# Patient Record
Sex: Female | Born: 1965 | Hispanic: Yes | Marital: Married | Smoking: Never smoker
Health system: Southern US, Community
[De-identification: ages and names within clinical notes are randomized; demographics above are authoritative.]

---

## 1999-09-05 ENCOUNTER — Encounter: Admission: RE | Admit: 1999-09-05 | Discharge: 1999-09-05 | Payer: Self-pay | Admitting: Gynecology

## 1999-09-05 ENCOUNTER — Encounter: Payer: Self-pay | Admitting: Gynecology

## 2004-06-13 ENCOUNTER — Ambulatory Visit: Payer: Self-pay | Admitting: Orthopedic Surgery

## 2004-07-07 ENCOUNTER — Ambulatory Visit: Payer: Self-pay | Admitting: Obstetrics & Gynecology

## 2005-10-07 ENCOUNTER — Emergency Department: Payer: Self-pay | Admitting: Emergency Medicine

## 2006-09-20 ENCOUNTER — Ambulatory Visit: Payer: Self-pay | Admitting: Obstetrics & Gynecology

## 2007-11-26 ENCOUNTER — Ambulatory Visit: Payer: Self-pay | Admitting: Obstetrics & Gynecology

## 2009-01-21 ENCOUNTER — Ambulatory Visit: Payer: Self-pay | Admitting: Obstetrics & Gynecology

## 2009-01-27 ENCOUNTER — Ambulatory Visit: Payer: Self-pay | Admitting: Obstetrics & Gynecology

## 2009-07-22 ENCOUNTER — Ambulatory Visit: Payer: Self-pay | Admitting: General Surgery

## 2010-07-31 ENCOUNTER — Encounter: Payer: Self-pay | Admitting: Unknown Physician Specialty

## 2010-10-11 ENCOUNTER — Ambulatory Visit: Payer: Self-pay | Admitting: Obstetrics & Gynecology

## 2011-10-03 ENCOUNTER — Ambulatory Visit (INDEPENDENT_AMBULATORY_CARE_PROVIDER_SITE_OTHER): Payer: BC Managed Care – PPO | Admitting: Family Medicine

## 2011-10-03 ENCOUNTER — Encounter: Payer: Self-pay | Admitting: Family Medicine

## 2011-10-03 VITALS — BP 100/60 | HR 76 | Temp 97.9°F

## 2011-10-03 DIAGNOSIS — Z0000 Encounter for general adult medical examination without abnormal findings: Secondary | ICD-10-CM

## 2011-10-03 DIAGNOSIS — Z136 Encounter for screening for cardiovascular disorders: Secondary | ICD-10-CM

## 2011-10-03 LAB — CBC WITH DIFFERENTIAL/PLATELET
Basophils Absolute: 0 10*3/uL (ref 0.0–0.1)
Basophils Relative: 0.7 % (ref 0.0–3.0)
Eosinophils Absolute: 0.1 10*3/uL (ref 0.0–0.7)
Eosinophils Relative: 1.3 % (ref 0.0–5.0)
HCT: 39.8 % (ref 36.0–46.0)
Hemoglobin: 13.2 g/dL (ref 12.0–15.0)
Lymphocytes Relative: 30.9 % (ref 12.0–46.0)
Lymphs Abs: 1.4 10*3/uL (ref 0.7–4.0)
MCHC: 33.2 g/dL (ref 30.0–36.0)
MCV: 93.8 fl (ref 78.0–100.0)
Monocytes Absolute: 0.3 10*3/uL (ref 0.1–1.0)
Monocytes Relative: 6.4 % (ref 3.0–12.0)
Neutro Abs: 2.7 10*3/uL (ref 1.4–7.7)
Neutrophils Relative %: 60.7 % (ref 43.0–77.0)
Platelets: 193 10*3/uL (ref 150.0–400.0)
RBC: 4.24 Mil/uL (ref 3.87–5.11)
RDW: 13.1 % (ref 11.5–14.6)
WBC: 4.4 10*3/uL — ABNORMAL LOW (ref 4.5–10.5)

## 2011-10-03 LAB — COMPREHENSIVE METABOLIC PANEL WITH GFR
ALT: 16 U/L (ref 0–35)
AST: 21 U/L (ref 0–37)
Albumin: 3.9 g/dL (ref 3.5–5.2)
Alkaline Phosphatase: 48 U/L (ref 39–117)
BUN: 14 mg/dL (ref 6–23)
CO2: 29 meq/L (ref 19–32)
Calcium: 9.2 mg/dL (ref 8.4–10.5)
Chloride: 105 meq/L (ref 96–112)
Creatinine, Ser: 0.8 mg/dL (ref 0.4–1.2)
GFR: 79.6 mL/min
Glucose, Bld: 76 mg/dL (ref 70–99)
Potassium: 4.4 meq/L (ref 3.5–5.1)
Sodium: 139 meq/L (ref 135–145)
Total Bilirubin: 0.4 mg/dL (ref 0.3–1.2)
Total Protein: 6.7 g/dL (ref 6.0–8.3)

## 2011-10-03 LAB — LIPID PANEL
Cholesterol: 133 mg/dL (ref 0–200)
HDL: 58.6 mg/dL (ref >39.00)
LDL Cholesterol: 73 mg/dL (ref 0–99)
Total CHOL/HDL Ratio: 2
Triglycerides: 9 mg/dL (ref 0.0–149.0)
VLDL: 1.8 mg/dL (ref 0.0–40.0)

## 2011-10-03 NOTE — Patient Instructions (Signed)
It was so wonderful to meet you. We will call you with results from your blood tests from today.

## 2011-10-03 NOTE — Progress Notes (Signed)
  Subjective:    Patient ID: Debra Wheeler, female    DOB: [birth year], 46 y.o.   MRN: [id number]  HPI  46 yo here to establish care.   G2P2- followed by Annamarie Major, [facility]. Pap from earlier this month revealed ASCUS, neg HPV--advised repeat pap in 1 year.  Strong FH of breast cancer- mother died of breast CA at 83 yo. Sister just had mastectomy due to breast cancer at 44 yo. She had BRCA testing- brings results in with her today-  BRCA1 neg, BRCA2 uncertain.  Has not had other labs checked in years.  Otherwise, very healthy and active.    There is no problem list on file for this patient.  No past medical history on file. No past surgical history on file. History  Substance Use Topics  . Smoking status: Never Smoker   . Smokeless tobacco: Not on file  . Alcohol Use: Not on file   Family History  Problem Relation Age of Onset  . Cancer Sister 60    breast- BRCA positive   Allergies  Allergen Reactions  . Penicillins Rash   No current outpatient prescriptions on file prior to visit.   The PMH, PSH, Social History, Family History, Medications, and allergies have been reviewed in [facility], and have been updated if relevant.   Review of Systems    See HPI Patient reports no  vision/ hearing changes,anorexia, weight change, fever ,adenopathy, persistant / recurrent hoarseness, swallowing issues, chest pain, edema,persistant / recurrent cough, hemoptysis, dyspnea(rest, exertional, paroxysmal nocturnal), gastrointestinal  bleeding (melena, rectal bleeding), abdominal pain, excessive heart burn, GU symptoms(dysuria, hematuria, pyuria, voiding/incontinence  Issues) syncope, focal weakness, severe memory loss, concerning skin lesions, depression, anxiety, abnormal bruising/bleeding, major joint swelling, breast masses or abnormal vaginal bleeding.    Objective:   Physical Exam BP 100/60  Pulse 76  Temp(Src) 97.9 F (36.6 C) (Oral)  LMP 09/27/2011  General:   Well-developed,well-nourished,in no acute distress; alert,appropriate and cooperative throughout examination Head:  normocephalic and atraumatic.   Eyes:  vision grossly intact, pupils equal, pupils round, and pupils reactive to light.   Ears:  R ear normal and L ear normal.   Nose:  no external deformity.   Mouth:  good dentition.   Neck:  No deformities, masses, or tenderness noted. Lungs:  Normal respiratory effort, chest expands symmetrically. Lungs are clear to auscultation, no crackles or wheezes. Heart:  Normal rate and regular rhythm. S1 and S2 normal without gallop, murmur, click, rub or other extra sounds. Abdomen:  Bowel sounds positive,abdomen soft and non-tender without masses, organomegaly or hernias noted. Msk:  No deformity or scoliosis noted of thoracic or lumbar spine.   Extremities:  No clubbing, cyanosis, edema, or deformity noted with normal full range of motion of all joints.   Neurologic:  alert & oriented X3 and gait normal.   Skin:  Intact without suspicious lesions or rashes Psych:  Cognition and judgment appear intact. Alert and cooperative with normal attention span and concentration. No apparent delusions, illusions, hallucinations     Assessment & Plan:   1. Routine general medical examination at a health care facility  Comprehensive metabolic panel, CBC with Differential Lipid Panel   Reviewed preventive care protocols, scheduled due services, and updated immunizations Discussed nutrition, exercise, diet, and healthy lifestyle.

## 2011-12-13 ENCOUNTER — Telehealth: Payer: Self-pay

## 2011-12-13 NOTE — Telephone Encounter (Signed)
Pt request copy of 09/2011 lab results. Copy of lab results left at front desk for pick up.

## 2012-01-19 ENCOUNTER — Telehealth: Payer: Self-pay

## 2012-01-19 NOTE — Telephone Encounter (Signed)
Debra Limerick, do we need a referral for this or can pt call? Thanks, [employer]

## 2012-01-19 NOTE — Telephone Encounter (Signed)
Pt said vascular and vein doctor in [place] pt has been seeing has closed and pt request referral to another vascular vein dr for treatment of spider veins.Please advise.

## 2012-01-22 NOTE — Telephone Encounter (Signed)
Noted! Thank you

## 2012-01-22 NOTE — Telephone Encounter (Signed)
Called [facility], they do not require a referral to be seen. Called the patient and gave her their phone and address and she will call directly to make her own appt.

## 2013-11-14 ENCOUNTER — Encounter: Payer: Self-pay | Admitting: Internal Medicine

## 2013-11-14 ENCOUNTER — Ambulatory Visit (INDEPENDENT_AMBULATORY_CARE_PROVIDER_SITE_OTHER): Payer: BC Managed Care – PPO | Admitting: Internal Medicine

## 2013-11-14 ENCOUNTER — Other Ambulatory Visit (HOSPITAL_COMMUNITY)
Admission: RE | Admit: 2013-11-14 | Discharge: 2013-11-14 | Disposition: A | Discharge status: Home / Self Care | Payer: BC Managed Care – PPO | Source: Ambulatory Visit | Attending: Internal Medicine | Admitting: Internal Medicine

## 2013-11-14 VITALS — BP 114/68 | HR 79 | Temp 98.5°F | Ht 67.25 in | Wt 167.2 lb

## 2013-11-14 DIAGNOSIS — Z0000 Encounter for general adult medical examination without abnormal findings: Secondary | ICD-10-CM

## 2013-11-14 DIAGNOSIS — Z01419 Encounter for gynecological examination (general) (routine) without abnormal findings: Secondary | ICD-10-CM | POA: Insufficient documentation

## 2013-11-14 DIAGNOSIS — A6000 Herpesviral infection of urogenital system, unspecified: Secondary | ICD-10-CM

## 2013-11-14 DIAGNOSIS — Z124 Encounter for screening for malignant neoplasm of cervix: Secondary | ICD-10-CM

## 2013-11-14 DIAGNOSIS — Z1239 Encounter for other screening for malignant neoplasm of breast: Secondary | ICD-10-CM

## 2013-11-14 DIAGNOSIS — Z1151 Encounter for screening for human papillomavirus (HPV): Secondary | ICD-10-CM | POA: Insufficient documentation

## 2013-11-14 LAB — COMPREHENSIVE METABOLIC PANEL
ALT: 21 U/L (ref 0–35)
AST: 24 U/L (ref 0–37)
Albumin: 4.1 g/dL (ref 3.5–5.2)
Alkaline Phosphatase: 54 U/L (ref 39–117)
BUN: 21 mg/dL (ref 6–23)
CO2: 30 mEq/L (ref 19–32)
Calcium: 9.3 mg/dL (ref 8.4–10.5)
Chloride: 107 mEq/L (ref 96–112)
Creatinine, Ser: 1.1 mg/dL (ref 0.4–1.2)
GFR: 60.1 mL/min (ref >60.00)
Glucose, Bld: 71 mg/dL (ref 70–99)
Potassium: 4.1 mEq/L (ref 3.5–5.1)
Sodium: 142 mEq/L (ref 135–145)
Total Bilirubin: 0.5 mg/dL (ref 0.2–1.2)
Total Protein: 6.8 g/dL (ref 6.0–8.3)

## 2013-11-14 LAB — LIPID PANEL
Cholesterol: 180 mg/dL (ref 0–200)
HDL: 72.2 mg/dL (ref >39.00)
LDL Cholesterol: 101 mg/dL — ABNORMAL HIGH (ref 0–99)
Total CHOL/HDL Ratio: 2
Triglycerides: 35 mg/dL (ref 0.0–149.0)
VLDL: 7 mg/dL (ref 0.0–40.0)

## 2013-11-14 LAB — CBC
HCT: 40 % (ref 36.0–46.0)
Hemoglobin: 13.4 g/dL (ref 12.0–15.0)
MCHC: 33.4 g/dL (ref 30.0–36.0)
MCV: 92.4 fl (ref 78.0–100.0)
Platelets: 228 10*3/uL (ref 150.0–400.0)
RBC: 4.33 Mil/uL (ref 3.87–5.11)
RDW: 13.7 % (ref 11.5–15.5)
WBC: 5.1 10*3/uL (ref 4.0–10.5)

## 2013-11-14 MED ORDER — VALACYCLOVIR HCL 500 MG PO TABS: 500.0000 mg | ORAL_TABLET | Freq: Two times a day (BID) | ORAL | Status: DC

## 2013-11-14 NOTE — Patient Instructions (Addendum)
See Shirlee Limerick on your way out to schedule mammogram  Health Maintenance, Female A healthy lifestyle and preventative care can promote health and wellness.  Maintain regular health, dental, and eye exams.  Eat a healthy diet. Foods like vegetables, fruits, whole grains, low-fat dairy products, and lean protein foods contain the nutrients you need without too many calories. Decrease your intake of foods high in solid fats, added sugars, and salt. Get information about a proper diet from your caregiver, if necessary.  Regular physical exercise is one of the most important things you can do for your health. Most adults should get at least 150 minutes of moderate-intensity exercise (any activity that increases your heart rate and causes you to sweat) each week. In addition, most adults need muscle-strengthening exercises on 2 or more days a week.   Maintain a healthy weight. The body mass index (BMI) is a screening tool to identify possible weight problems. It provides an estimate of body fat based on height and weight. Your caregiver can help determine your BMI, and can help you achieve or maintain a healthy weight. For adults 20 years and older:  A BMI below 18.5 is considered underweight.  A BMI of 18.5 to 24.9 is normal.  A BMI of 25 to 29.9 is considered overweight.  A BMI of 30 and above is considered obese.  Maintain normal blood lipids and cholesterol by exercising and minimizing your intake of saturated fat. Eat a balanced diet with plenty of fruits and vegetables. Blood tests for lipids and cholesterol should begin at age 39 and be repeated every 5 years. If your lipid or cholesterol levels are high, you are over 50, or you are a high risk for heart disease, you may need your cholesterol levels checked more frequently.Ongoing high lipid and cholesterol levels should be treated with medicines if diet and exercise are not effective.  If you smoke, find out from your caregiver how to quit.  If you do not use tobacco, do not start.  Lung cancer screening is recommended for adults aged 58 80 years who are at high risk for developing lung cancer because of a history of smoking. Yearly low-dose computed tomography (CT) is recommended for people who have at least a 30-pack-year history of smoking and are a current smoker or have quit within the past 15 years. A pack year of smoking is smoking an average of 1 pack of cigarettes a day for 1 year (for example: 1 pack a day for 30 years or 2 packs a day for 15 years). Yearly screening should continue until the smoker has stopped smoking for at least 15 years. Yearly screening should also be stopped for people who develop a health problem that would prevent them from having lung cancer treatment.  If you are pregnant, do not drink alcohol. If you are breastfeeding, be very cautious about drinking alcohol. If you are not pregnant and choose to drink alcohol, do not exceed 1 drink per day. One drink is considered to be 12 ounces (355 mL) of beer, 5 ounces (148 mL) of wine, or 1.5 ounces (44 mL) of liquor.  Avoid use of street drugs. Do not share needles with anyone. Ask for help if you need support or instructions about stopping the use of drugs.  High blood pressure causes heart disease and increases the risk of stroke. Blood pressure should be checked at least every 1 to 2 years. Ongoing high blood pressure should be treated with medicines, if weight loss and  exercise are not effective.  If you are 55 to 46 years old, ask your caregiver if you should take aspirin to prevent strokes.  Diabetes screening involves taking a blood sample to check your fasting blood sugar level. This should be done once every 3 years, after age 45, if you are within normal weight and without risk factors for diabetes. Testing should be considered at a younger age or be carried out more frequently if you are overweight and have at least 1 risk factor for diabetes.  Breast  cancer screening is essential preventative care for women. You should practice "breast self-awareness." This means understanding the normal appearance and feel of your breasts and may include breast self-examination. Any changes detected, no matter how small, should be reported to a caregiver. Women in their 20s and 30s should have a clinical breast exam (CBE) by a caregiver as part of a regular health exam every 1 to 3 years. After age 40, women should have a CBE every year. Starting at age 40, women should consider having a mammogram (breast X-ray) every year. Women who have a family history of breast cancer should talk to their caregiver about genetic screening. Women at a high risk of breast cancer should talk to their caregiver about having an MRI and a mammogram every year.  Breast cancer gene (BRCA)-related cancer risk assessment is recommended for women who have family members with BRCA-related cancers. BRCA-related cancers include breast, ovarian, tubal, and peritoneal cancers. Having family members with these cancers may be associated with an increased risk for harmful changes (mutations) in the breast cancer genes BRCA1 and BRCA2. Results of the assessment will determine the need for genetic counseling and BRCA1 and BRCA2 testing.  The Pap test is a screening test for cervical cancer. Women should have a Pap test starting at age 21. Between ages 21 and 29, Pap tests should be repeated every 2 years. Beginning at age 30, you should have a Pap test every 3 years as long as the past 3 Pap tests have been normal. If you had a hysterectomy for a problem that was not cancer or a condition that could lead to cancer, then you no longer need Pap tests. If you are between ages 65 and 70, and you have had normal Pap tests going back 10 years, you no longer need Pap tests. If you have had past treatment for cervical cancer or a condition that could lead to cancer, you need Pap tests and screening for cancer for  at least 20 years after your treatment. If Pap tests have been discontinued, risk factors (such as a new sexual partner) need to be reassessed to determine if screening should be resumed. Some women have medical problems that increase the chance of getting cervical cancer. In these cases, your caregiver may recommend more frequent screening and Pap tests.  The human papillomavirus (HPV) test is an additional test that may be used for cervical cancer screening. The HPV test looks for the virus that can cause the cell changes on the cervix. The cells collected during the Pap test can be tested for HPV. The HPV test could be used to screen women aged 30 years and older, and should be used in women of any age who have unclear Pap test results. After the age of 30, women should have HPV testing at the same frequency as a Pap test.  Colorectal cancer can be detected and often prevented. Most routine colorectal cancer screening begins at the age   of 50 and continues through age 75. However, your caregiver may recommend screening at an earlier age if you have risk factors for colon cancer. On a yearly basis, your caregiver may provide home test kits to check for hidden blood in the stool. Use of a small camera at the end of a tube, to directly examine the colon (sigmoidoscopy or colonoscopy), can detect the earliest forms of colorectal cancer. Talk to your caregiver about this at age 50, when routine screening begins. Direct examination of the colon should be repeated every 5 to 10 years through age 75, unless early forms of pre-cancerous polyps or small growths are found.  Hepatitis C blood testing is recommended for all people born from 1945 through 1965 and any individual with known risks for hepatitis C.  Practice safe sex. Use condoms and avoid high-risk sexual practices to reduce the spread of sexually transmitted infections (STIs). Sexually active women aged 25 and younger should be checked for Chlamydia,  which is a common sexually transmitted infection. Older women with new or multiple partners should also be tested for Chlamydia. Testing for other STIs is recommended if you are sexually active and at increased risk.  Osteoporosis is a disease in which the bones lose minerals and strength with aging. This can result in serious bone fractures. The risk of osteoporosis can be identified using a bone density scan. Women ages 65 and over and women at risk for fractures or osteoporosis should discuss screening with their caregivers. Ask your caregiver whether you should be taking a calcium supplement or vitamin D to reduce the rate of osteoporosis.  Menopause can be associated with physical symptoms and risks. Hormone replacement therapy is available to decrease symptoms and risks. You should talk to your caregiver about whether hormone replacement therapy is right for you.  Use sunscreen. Apply sunscreen liberally and repeatedly throughout the day. You should seek shade when your shadow is shorter than you. Protect yourself by wearing long sleeves, pants, a wide-brimmed hat, and sunglasses year round, whenever you are outdoors.  Notify your caregiver of new moles or changes in moles, especially if there is a change in shape or color. Also notify your caregiver if a mole is larger than the size of a pencil eraser.  Stay current with your immunizations. Document Released: 01/09/2011 Document Revised: 10/21/2012 Document Reviewed: 01/09/2011 Exit[employer] Patient Information 2014 [employer].  

## 2013-11-14 NOTE — Progress Notes (Signed)
Pre visit review using our clinic review tool, if applicable. No additional management support is needed unless otherwise documented below in the visit note. 

## 2013-11-14 NOTE — Addendum Note (Signed)
Addended by: Desmond DikeKNIGHT, Janayia Burggraf H on: 11/14/2013 09:10 AM   Modules accepted: Orders

## 2013-11-14 NOTE — Progress Notes (Signed)
Subjective:    Patient ID: Debra Wheeler, female    DOB: [birth year], 48 y.o.   MRN: [id number]  HPI  Pt presents to the clinic today today for her annual exam. She does have some concerns today about oral and genital herpes. She did get this from her husband. He is treated with valacyclovir for out breaks. She has been using his medicine when she has outbreaks but would like to know if she can get her on RX. Also, she is concerned with weight gain. She has gained about 15 pounds since she started going through menopause. She is working with a Physiological scientist but is not losing weight. Her trainer asked her to inquire about phentermine.  Flu: never Tetanus: 15 years ago LMP: menopausal Pap Smear: more than years ago Mammogram: more than 2 years ago, family hx of breast cancer Eye Doctor: as needed Dentist: biannually  Review of Systems      No past medical history on file.  Current Outpatient Prescriptions  Medication Sig Dispense Refill  . Multiple Vitamin (MULTIVITAMIN) tablet Take 1 tablet by mouth daily.       No current facility-administered medications for this visit.    Allergies  Allergen Reactions  . Penicillins Rash    Family History  Problem Relation Age of Onset  . Cancer Sister 40    breast- BRCA positive    History   Social History  . Marital Status: Married    Spouse Name: N/A    Number of Children: 2  . Years of Education: N/A   Occupational History  . Not on file.   Social History Main Topics  . Smoking status: Never Smoker   . Smokeless tobacco: Not on file  . Alcohol Use: Not on file  . Drug Use: Not on file  . Sexual Activity: Not on file   Other Topics Concern  . Not on file   Social History Narrative  . No narrative on file     Constitutional: Pt reports weight gain. Denies fever, malaise, fatigue, headache.  HEENT: Denies eye pain, eye redness, ear pain, ringing in the ears, wax buildup, runny nose, nasal congestion,  bloody nose, or sore throat. Respiratory: Denies difficulty breathing, shortness of breath, cough or sputum production.   Cardiovascular: Denies chest pain, chest tightness, palpitations or swelling in the hands or feet.  Gastrointestinal: Denies abdominal pain, bloating, constipation, diarrhea or blood in the stool.  GU: Denies urgency, frequency, pain with urination, burning sensation, blood in urine, odor or discharge. Musculoskeletal: Denies decrease in range of motion, difficulty with gait, muscle pain or joint pain and swelling.  Skin: Denies redness, rashes, lesions or ulcercations.  Neurological: Denies dizziness, difficulty with memory, difficulty with speech or problems with balance and coordination.   No other specific complaints in a complete review of systems (except as listed in HPI above).  Objective:   Physical Exam   BP 114/68  Pulse 79  Temp(Src) 98.5 F (36.9 C) (Oral)  Ht 5' 7.25" (1.708 m)  Wt 167 lb 4 oz (75.864 kg)  BMI 26.01 kg/m2  SpO2 99%  LMP 11/07/2013 Wt Readings from Last 3 Encounters:  11/14/13 167 lb 4 oz (75.864 kg)    Constitutional:  Alert, oriented x 4, well developed, well nourished in no apparent distress. Skin: Skin is warm and dry.  No erythema, lesion or ulceration noted. HEENT: Head: normal shape and size; Eyes: sclera white, no icterus, conjunctiva pink, PERRLA and EOMs intact; Ears:  Tm's gray and intact, normal light reflex; Nose: mucosa pink and moist, septum midline; Throat/Mouth: Teeth present, , mucosa pink and moist, no lesions or ulcerations noted. Neck: Normal range of motion. Neck supple, trachea midline. No massses, lumps or thyromegaly present.  Cardiovascular: Normal rate and rhythm. S1,S2 noted.  No murmur, rubs or gallops noted. No JVD or BLE edema. No carotid bruits noted. Pulmonary/Chest: Normal effort and positive vesicular breath sounds. No respiratory distress. No wheezes, rales or ronchi noted.  Abdomen: Soft and  nontender. Normal bowel sounds, no bruits noted. No distention or masses noted. Liver, spleen and kidneys non palpable. Genitourinary: Normal female anatomy. Uterus midline, anterior and soft. No CMT or discharge noted. Adenexa non palpable. Breast with fibrocystic changes.  Musculoskeletal: Normal range of motion. Patient exhibits no effusions.  Neurological: Alert and oriented. Cranial nerves II-XII intact. Coordination normal. +DTRs bilaterally. Psychiatric: She has a normal mood and affect. Behavior is normal. Judgment and thought content normal.    BMET    Component Value Date/Time   NA 139 10/03/2011 1147   K 4.4 10/03/2011 1147   CL 105 10/03/2011 1147   CO2 29 10/03/2011 1147   GLUCOSE 76 10/03/2011 1147   BUN 14 10/03/2011 1147   CREATININE 0.8 10/03/2011 1147   CALCIUM 9.2 10/03/2011 1147    Lipid Panel     Component Value Date/Time   CHOL 133 10/03/2011 1147   TRIG 9.0 10/03/2011 1147   HDL 58.60 10/03/2011 1147   CHOLHDL 2 10/03/2011 1147   VLDL 1.8 10/03/2011 1147   LDLCALC 73 10/03/2011 1147    CBC    Component Value Date/Time   WBC 4.4* 10/03/2011 1147   RBC 4.24 10/03/2011 1147   HGB 13.2 10/03/2011 1147   HCT 39.8 10/03/2011 1147   PLT 193.0 10/03/2011 1147   MCV 93.8 10/03/2011 1147   MCHC 33.2 10/03/2011 1147   RDW 13.1 10/03/2011 1147   LYMPHSABS 1.4 10/03/2011 1147   MONOABS 0.3 10/03/2011 1147   EOSABS 0.1 10/03/2011 1147   BASOSABS 0.0 10/03/2011 1147    Hgb A1C No results found for this basename: HGBA1C        Assessment & Plan:   Preventative Health Maintenance:  She declines flu and Tdap today Will check basic screening labs today Encouraged her to continue to work on diet and exercise- discussed phentermine, her BMI is < 27, so we can not start phentermine Will see Rosaria Ferries to schedule mammogram Pap smear obtained today, will call you with the results  Genital Herpes:  eRx for valacyclovir  RTC in 1 year or sooner if needed

## 2014-06-01 ENCOUNTER — Encounter: Payer: Self-pay | Admitting: Physician Assistant

## 2014-06-01 ENCOUNTER — Ambulatory Visit (INDEPENDENT_AMBULATORY_CARE_PROVIDER_SITE_OTHER): Payer: BC Managed Care – PPO | Admitting: Family Medicine

## 2014-06-01 ENCOUNTER — Encounter: Payer: Self-pay | Admitting: Family Medicine

## 2014-06-01 VITALS — BP 114/70 | HR 69 | Temp 98.1°F | Wt 152.5 lb

## 2014-06-01 DIAGNOSIS — Z800 Family history of malignant neoplasm of digestive organs: Secondary | ICD-10-CM | POA: Insufficient documentation

## 2014-06-01 DIAGNOSIS — K625 Hemorrhage of anus and rectum: Secondary | ICD-10-CM

## 2014-06-01 DIAGNOSIS — R351 Nocturia: Secondary | ICD-10-CM

## 2014-06-01 LAB — BASIC METABOLIC PANEL
BUN: 19 mg/dL (ref 6–23)
CHLORIDE: 101 meq/L (ref 96–112)
CO2: 27 mEq/L (ref 19–32)
Calcium: 9.7 mg/dL (ref 8.4–10.5)
Creatinine, Ser: 1 mg/dL (ref 0.4–1.2)
GFR: 63.43 mL/min (ref >60.00)
GLUCOSE: 83 mg/dL (ref 70–99)
Potassium: 4 mEq/L (ref 3.5–5.1)
SODIUM: 137 meq/L (ref 135–145)

## 2014-06-01 LAB — POCT URINALYSIS DIPSTICK
Bilirubin, UA: NEGATIVE
GLUCOSE UA: NEGATIVE
KETONES UA: NEGATIVE
Leukocytes, UA: NEGATIVE
Nitrite, UA: NEGATIVE
Protein, UA: NEGATIVE
RBC UA: NEGATIVE
Spec Grav, UA: >=1.03
UROBILINOGEN UA: 0.2
pH, UA: 6

## 2014-06-01 LAB — HEMOGLOBIN A1C: Hgb A1c MFr Bld: 5.7 % (ref 4.6–6.5)

## 2014-06-01 NOTE — Assessment & Plan Note (Signed)
New with guaiac pos stool in office. Sister also recently diagnosed with colon CA. No external hemorrhoids- ? Small anal fissure. Internal hemorrhoids likely. Refer to GI.

## 2014-06-01 NOTE — Patient Instructions (Signed)
Good to see you. Please stop by to see Debra Wheeler on your way out. I will call you with your results.

## 2014-06-01 NOTE — Progress Notes (Signed)
Pre visit review using our clinic review tool, if applicable. No additional management support is needed unless otherwise documented below in the visit note. 

## 2014-06-01 NOTE — Assessment & Plan Note (Signed)
New- persistent. UA neg. Will check a1c and BMET today. Mild pelvic organ prolapse.  May need to start detrol and/r refer to gyn to discuss tx options.

## 2014-06-01 NOTE — Progress Notes (Signed)
   Subjective:   Patient ID: Debra Wheeler, female    DOB: [birth year], 48 y.o.   MRN: [id number]  Debra Wheeler is a pleasant 48 y.o. year old female who presents to clinic today with Rectal Bleeding  on 06/01/2014  HPI: Rectal bleeding- intermittent issue since her children were born. Remote h/o hemorrhoidectomy.  Does have h/o constipation/hard stools.  Has less bleeding when she takes a stool softener.  Sometimes BMs painful.  No mucous in stool.  Blood is bright red. Sister recently diagnosed with colon CA.  She is currently menopausal.  Having night time awakenings for urination- up to 3 times a night for past 2 or 3 months.  No dysuria.  Current Outpatient Prescriptions on File Prior to Visit  Medication Sig Dispense Refill  . Multiple Vitamin (MULTIVITAMIN) tablet Take 1 tablet by mouth daily.    . valACYclovir (VALTREX) 500 MG tablet Take 1 tablet (500 mg total) by mouth 2 (two) times daily. 60 tablet 2   No current facility-administered medications on file prior to visit.    Allergies  Allergen Reactions  . Penicillins Rash    No past medical history on file.  No past surgical history on file.  Family History  Problem Relation Age of Onset  . Cancer Sister 75    breast- BRCA positive    History   Social History  . Marital Status: Married    Spouse Name: N/A    Number of Children: 2  . Years of Education: N/A   Occupational History  . Not on file.   Social History Main Topics  . Smoking status: Never Smoker   . Smokeless tobacco: Not on file  . Alcohol Use: Not on file  . Drug Use: Not on file  . Sexual Activity: Not on file   Other Topics Concern  . Not on file   Social History Narrative   The PMH, PSH, Social History, Family History, Medications, and allergies have been reviewed in [facility], and have been updated if relevant.   Review of Systems  Gastrointestinal: Positive for constipation, blood in stool and anal bleeding. Negative for  abdominal pain and diarrhea.  Endocrine: Positive for polyuria. Negative for polydipsia and polyphagia.  Genitourinary: Negative for dysuria.  All other systems reviewed and are negative.      Objective:    BP 114/70 mmHg  Pulse 69  Temp(Src) 98.1 F (36.7 C) (Oral)  Wt 152 lb 8 oz (69.174 kg)  SpO2 98%   Physical Exam  Constitutional: She is oriented to person, place, and time. She appears well-developed and well-nourished. No distress.  Genitourinary: Rectal exam shows no external hemorrhoid, no internal hemorrhoid, no mass, no tenderness and anal tone normal. Guaiac positive stool. There is no rash or tenderness on the right labia. There is no rash or tenderness on the left labia. No erythema in the vagina.  Mild pelvic organ prolapse with valsalva  Neurological: She is alert and oriented to person, place, and time.  Skin: Skin is warm and dry.  Psychiatric: She has a normal mood and affect. Her behavior is normal. Judgment and thought content normal.  Nursing note and vitals reviewed.         Assessment & Plan:   Rectal bleeding - Plan: Ambulatory referral to Gastroenterology  Frequent urination at night - Plan: Basic Metabolic Panel  Family history of colon cancer No Follow-up on file.

## 2014-06-11 ENCOUNTER — Ambulatory Visit: Payer: BC Managed Care – PPO | Admitting: Physician Assistant

## 2014-11-20 ENCOUNTER — Encounter: Payer: BC Managed Care – PPO | Admitting: Internal Medicine

## 2015-06-21 ENCOUNTER — Other Ambulatory Visit: Payer: Self-pay | Admitting: Family Medicine

## 2015-06-21 DIAGNOSIS — Z1231 Encounter for screening mammogram for malignant neoplasm of breast: Secondary | ICD-10-CM

## 2015-06-22 ENCOUNTER — Ambulatory Visit
Admission: RE | Admit: 2015-06-22 | Discharge: 2015-06-22 | Disposition: A | Discharge status: Home / Self Care | Payer: BLUE CROSS/BLUE SHIELD | Source: Ambulatory Visit | Attending: Family Medicine | Admitting: Family Medicine

## 2015-06-22 DIAGNOSIS — Z1231 Encounter for screening mammogram for malignant neoplasm of breast: Secondary | ICD-10-CM | POA: Diagnosis not present

## 2015-07-02 ENCOUNTER — Encounter: Payer: Self-pay | Admitting: Family Medicine

## 2015-07-02 ENCOUNTER — Ambulatory Visit (INDEPENDENT_AMBULATORY_CARE_PROVIDER_SITE_OTHER): Payer: BLUE CROSS/BLUE SHIELD | Admitting: Family Medicine

## 2015-07-02 VITALS — BP 92/50 | HR 76 | Temp 99.0°F | Wt 160.0 lb

## 2015-07-02 DIAGNOSIS — R059 Cough, unspecified: Secondary | ICD-10-CM | POA: Insufficient documentation

## 2015-07-02 DIAGNOSIS — R05 Cough: Secondary | ICD-10-CM | POA: Diagnosis not present

## 2015-07-02 MED ORDER — GUAIFENESIN-CODEINE 100-10 MG/5ML PO SYRP
5.0000 mL | ORAL_SOLUTION | Freq: Every evening | ORAL | Status: DC | PRN
Start: 2015-07-02 — End: 2015-09-14

## 2015-07-02 MED ORDER — AZITHROMYCIN 250 MG PO TABS: ORAL_TABLET | ORAL | Status: DC

## 2015-07-02 NOTE — Progress Notes (Signed)
Pre visit review using our clinic review tool, if applicable. No additional management support is needed unless otherwise documented below in the visit note. 

## 2015-07-02 NOTE — Patient Instructions (Addendum)
Can use cough supressant at night as needed .  Mucinex DM during the day.    Viral Infections A viral infection can be caused by different types of viruses.Most viral infections are not serious and resolve on their own. However, some infections may cause severe symptoms and may lead to further complications. SYMPTOMS Viruses can frequently cause:  Minor sore throat.  Aches and pains.  Headaches.  Runny nose.  Different types of rashes.  Watery eyes.  Tiredness.  Cough.  Loss of appetite.  Gastrointestinal infections, resulting in nausea, vomiting, and diarrhea. These symptoms do not respond to antibiotics because the infection is not caused by bacteria. However, you might catch a bacterial infection following the viral infection. This is sometimes called a "superinfection." Symptoms of such a bacterial infection may include:  Worsening sore throat with pus and difficulty swallowing.  Swollen neck glands.  Chills and a high or persistent fever.  Severe headache.  Tenderness over the sinuses.  Persistent overall ill feeling (malaise), muscle aches, and tiredness (fatigue).  Persistent cough.  Yellow, green, or brown mucus production with coughing. HOME CARE INSTRUCTIONS   Only take over-the-counter or prescription medicines for pain, discomfort, diarrhea, or fever as directed by your caregiver.  Drink enough water and fluids to keep your urine clear or pale yellow. Sports drinks can provide valuable electrolytes, sugars, and hydration.  Get plenty of rest and maintain proper nutrition. Soups and broths with crackers or rice are fine. SEEK IMMEDIATE MEDICAL CARE IF:   You have severe headaches, shortness of breath, chest pain, neck pain, or an unusual rash.  You have uncontrolled vomiting, diarrhea, or you are unable to keep down fluids.  You or your child has an oral temperature above 102 F (38.9 C), not controlled by medicine.  Your baby is older than 3  months with a rectal temperature of 102 F (38.9 C) or higher.  Your baby is 633 months old or younger with a rectal temperature of 100.4 F (38 C) or higher. MAKE SURE YOU:   Understand these instructions.  Will watch your condition.  Will get help right away if you are not doing well or get worse.   This information is not intended to replace advice given to you by your health care provider. Make sure you discuss any questions you have with your health care provider.   Document Released: 04/05/2005 Document Revised: 09/18/2011 Document Reviewed: 12/02/2014 Elsevier Interactive Patient Education [employer]2016 Elsevier Inc.

## 2015-07-02 NOTE — Progress Notes (Signed)
   Subjective:    Patient ID: Debra Wheeler, female    DOB: 01/02/1966, 49 y.o.   MRN: [id number]  Cough This is a new problem. The current episode started in the past 7 days (2 days ago). The problem has been gradually worsening. The problem occurs every few minutes. The cough is productive of sputum. Associated symptoms include headaches, hemoptysis, nasal congestion, rhinorrhea and a sore throat. Pertinent negatives include no chills, ear pain, fever, rash, shortness of breath or wheezing. Associated symptoms comments: Small amount of blood in mucus. The symptoms are aggravated by lying down. Risk factors: nonsmoker. Treatments tried: Catering manageralka seltzer. The treatment provided mild relief. There is no history of asthma, COPD, emphysema or environmental allergies.   Social History /Family History/Past Medical History reviewed and updated if needed.   Review of Systems  Constitutional: Negative for fever and chills.  HENT: Positive for rhinorrhea and sore throat. Negative for ear pain.   Respiratory: Positive for cough and hemoptysis. Negative for shortness of breath and wheezing.   Skin: Negative for rash.  Allergic/Immunologic: Negative for environmental allergies.  Neurological: Positive for headaches.       Objective:   Physical Exam  Constitutional: Vital signs are normal. She appears well-developed and well-nourished. She is cooperative.  Non-toxic appearance. She does not appear ill. No distress.  HENT:  Head: Normocephalic.  Right Ear: Hearing, tympanic membrane, external ear and ear canal normal. Tympanic membrane is not erythematous, not retracted and not bulging.  Left Ear: Hearing, tympanic membrane, external ear and ear canal normal. Tympanic membrane is not erythematous, not retracted and not bulging.  Nose: Mucosal edema and rhinorrhea present. Right sinus exhibits no maxillary sinus tenderness and no frontal sinus tenderness. Left sinus exhibits no maxillary sinus tenderness  and no frontal sinus tenderness.  Mouth/Throat: Uvula is midline, oropharynx is clear and moist and mucous membranes are normal.  Eyes: Conjunctivae, EOM and lids are normal. Pupils are equal, round, and reactive to light. Lids are everted and swept, no foreign bodies found.  Neck: Trachea normal and normal range of motion. Neck supple. Carotid bruit is not present. No thyroid mass and no thyromegaly present.  Cardiovascular: Normal rate, regular rhythm, S1 normal, S2 normal, normal heart sounds, intact distal pulses and normal pulses.  Exam reveals no gallop and no friction rub.   No murmur heard. Pulmonary/Chest: Effort normal and breath sounds normal. No tachypnea. No respiratory distress. She has no decreased breath sounds. She has no wheezes. She has no rhonchi. She has no rales.  Neurological: She is alert.  Skin: Skin is warm, dry and intact. No rash noted.  Psychiatric: Her speech is normal and behavior is normal. Judgment normal. Her mood appears not anxious. Cognition and memory are normal. She does not exhibit a depressed mood.          Assessment & Plan:

## 2015-07-02 NOTE — Assessment & Plan Note (Signed)
Likely viral URI given 2 days of classic symtpoms in healthy female.  pt concerned about bacterial infeciton.  Recommended supportive care, symptomatic care.. If not getting SOME better in 5 -7 days.. Can fill antibiotics.

## 2015-09-14 ENCOUNTER — Other Ambulatory Visit (HOSPITAL_COMMUNITY)
Admission: RE | Admit: 2015-09-14 | Discharge: 2015-09-14 | Disposition: A | Discharge status: Home / Self Care | Payer: BLUE CROSS/BLUE SHIELD | Source: Ambulatory Visit | Attending: Family Medicine | Admitting: Family Medicine

## 2015-09-14 ENCOUNTER — Encounter: Payer: Self-pay | Admitting: Family Medicine

## 2015-09-14 ENCOUNTER — Ambulatory Visit (INDEPENDENT_AMBULATORY_CARE_PROVIDER_SITE_OTHER): Payer: BLUE CROSS/BLUE SHIELD | Admitting: Family Medicine

## 2015-09-14 VITALS — BP 108/60 | HR 61 | Temp 97.8°F | Ht 67.25 in | Wt 156.5 lb

## 2015-09-14 DIAGNOSIS — Z79890 Hormone replacement therapy (postmenopausal): Secondary | ICD-10-CM | POA: Insufficient documentation

## 2015-09-14 DIAGNOSIS — A6000 Herpesviral infection of urogenital system, unspecified: Secondary | ICD-10-CM

## 2015-09-14 DIAGNOSIS — Z0000 Encounter for general adult medical examination without abnormal findings: Secondary | ICD-10-CM

## 2015-09-14 DIAGNOSIS — Z01411 Encounter for gynecological examination (general) (routine) with abnormal findings: Secondary | ICD-10-CM | POA: Insufficient documentation

## 2015-09-14 DIAGNOSIS — N760 Acute vaginitis: Secondary | ICD-10-CM | POA: Diagnosis present

## 2015-09-14 DIAGNOSIS — Z01419 Encounter for gynecological examination (general) (routine) without abnormal findings: Secondary | ICD-10-CM

## 2015-09-14 DIAGNOSIS — E039 Hypothyroidism, unspecified: Secondary | ICD-10-CM

## 2015-09-14 DIAGNOSIS — Z113 Encounter for screening for infections with a predominantly sexual mode of transmission: Secondary | ICD-10-CM | POA: Insufficient documentation

## 2015-09-14 DIAGNOSIS — Z1151 Encounter for screening for human papillomavirus (HPV): Secondary | ICD-10-CM | POA: Diagnosis not present

## 2015-09-14 DIAGNOSIS — Z803 Family history of malignant neoplasm of breast: Secondary | ICD-10-CM

## 2015-09-14 DIAGNOSIS — Z800 Family history of malignant neoplasm of digestive organs: Secondary | ICD-10-CM

## 2015-09-14 LAB — T3, FREE: T3, Free: 3.4 pg/mL (ref 2.3–4.2)

## 2015-09-14 LAB — COMPREHENSIVE METABOLIC PANEL
ALK PHOS: 56 U/L (ref 39–117)
ALT: 9 U/L (ref 0–35)
AST: 15 U/L (ref 0–37)
Albumin: 4.4 g/dL (ref 3.5–5.2)
BILIRUBIN TOTAL: 0.4 mg/dL (ref 0.2–1.2)
BUN: 19 mg/dL (ref 6–23)
CALCIUM: 9.7 mg/dL (ref 8.4–10.5)
CO2: 28 mEq/L (ref 19–32)
Chloride: 103 mEq/L (ref 96–112)
Creatinine, Ser: 0.82 mg/dL (ref 0.40–1.20)
GFR: 79.31 mL/min (ref >60.00)
GLUCOSE: 93 mg/dL (ref 70–99)
Potassium: 3.9 mEq/L (ref 3.5–5.1)
Sodium: 140 mEq/L (ref 135–145)
TOTAL PROTEIN: 7.1 g/dL (ref 6.0–8.3)

## 2015-09-14 LAB — CBC WITH DIFFERENTIAL/PLATELET
BASOS ABS: 0 10*3/uL (ref 0.0–0.1)
Basophils Relative: 0.5 % (ref 0.0–3.0)
Eosinophils Absolute: 0.1 10*3/uL (ref 0.0–0.7)
Eosinophils Relative: 1.3 % (ref 0.0–5.0)
HEMATOCRIT: 39.5 % (ref 36.0–46.0)
HEMOGLOBIN: 13.1 g/dL (ref 12.0–15.0)
LYMPHS PCT: 27.4 % (ref 12.0–46.0)
Lymphs Abs: 1.7 10*3/uL (ref 0.7–4.0)
MCHC: 33.3 g/dL (ref 30.0–36.0)
MCV: 90.4 fl (ref 78.0–100.0)
MONOS PCT: 6.1 % (ref 3.0–12.0)
Monocytes Absolute: 0.4 10*3/uL (ref 0.1–1.0)
Neutro Abs: 4.1 10*3/uL (ref 1.4–7.7)
Neutrophils Relative %: 64.7 % (ref 43.0–77.0)
Platelets: 276 10*3/uL (ref 150.0–400.0)
RBC: 4.37 Mil/uL (ref 3.87–5.11)
RDW: 13.6 % (ref 11.5–15.5)
WBC: 6.3 10*3/uL (ref 4.0–10.5)

## 2015-09-14 LAB — LIPID PANEL
Cholesterol: 196 mg/dL (ref 0–200)
HDL: 73.4 mg/dL (ref >39.00)
LDL Cholesterol: 112 mg/dL — ABNORMAL HIGH (ref 0–99)
NONHDL: 122.78
TRIGLYCERIDES: 55 mg/dL (ref 0.0–149.0)
Total CHOL/HDL Ratio: 3
VLDL: 11 mg/dL (ref 0.0–40.0)

## 2015-09-14 LAB — T4, FREE: FREE T4: 0.63 ng/dL (ref 0.60–1.60)

## 2015-09-14 LAB — TSH: TSH: 1.14 u[IU]/mL (ref 0.35–4.50)

## 2015-09-14 NOTE — Assessment & Plan Note (Addendum)
Started two months ago. She will call to let me know what she is taking but I did advise her against taking HRT given her family history of breast and colon CA. Will order pelvic US to evaluate her ovaries as enlarged ovaries are linked to both breast and colon CA. The patient indicates understanding of these issues and agrees with the plan.  Order entered.

## 2015-09-14 NOTE — Progress Notes (Signed)
Subjective:    Patient ID: Debra Wheeler, female    DOB: [birth year], 50 y.o.   MRN: [id number]  HPI  50 yo here for CPX.   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 Last pap smear was done by [facility] on 11/14/13 and was normal- reviewed chart today.   Strong FH of breast cancer- mother died of breast CA at 92 yo. Sister just had mastectomy due to breast cancer at 48 yo. She had BRCA testing- BRCA1 neg, BRCA2 uncertain.  Mammogram 06/22/15  She is seeing Dr. Sharol Roussel as well and she is taking HRT and thyroid medications per pt. She did have an episode of palpitations last week.  She had alcohol with her thyroid medication. Has had no recurrent episodes.  Lab Results  Component Value Date   CHOL 180 11/14/2013   HDL 72.20 11/14/2013   LDLCALC 101* 11/14/2013   TRIG 35.0 11/14/2013   CHOLHDL 2 11/14/2013   Lab Results  Component Value Date   CREATININE 1.0 06/01/2014   No results found for: TSH Lab Results  Component Value Date   WBC 5.1 11/14/2013   HGB 13.4 11/14/2013   HCT 40.0 11/14/2013   MCV 92.4 11/14/2013   PLT 228.0 11/14/2013     Patient Active Problem List   Diagnosis Date Noted  . Well woman exam 09/14/2015  . Family history of colon cancer 06/01/2014  . Genital herpes 11/14/2013   No past medical history on file. No past surgical history on file. Social History  Substance Use Topics  . Smoking status: Never Smoker   . Smokeless tobacco: None  . Alcohol Use: None   Family History  Problem Relation Age of Onset  . Cancer Sister 45    breast- BRCA positive  . Breast cancer Sister   . Breast cancer Mother    Allergies  Allergen Reactions  . Penicillins Rash   No current outpatient prescriptions on file prior to visit.   No current facility-administered medications on file prior to visit.   The PMH, PSH, Social History, Family History, Medications, and allergies have been reviewed in [facility], and have been updated if relevant.   Review  of Systems  Constitutional: Negative.   HENT: Negative.   Respiratory: Negative.   Cardiovascular: Positive for palpitations. Negative for chest pain and leg swelling.  Endocrine: Negative.   Genitourinary: Negative.   Musculoskeletal: Negative.   Skin: Negative.   Allergic/Immunologic: Negative.   Neurological: Negative.   Hematological: Negative.   Psychiatric/Behavioral: Negative.   All other systems reviewed and are negative.       Objective:   Physical Exam BP 108/60 mmHg  Pulse 61  Temp(Src) 97.8 F (36.6 C) (Oral)  Ht 5' 7.25" (1.708 m)  Wt 156 lb 8 oz (70.988 kg)  BMI 24.33 kg/m2  SpO2 99%  LMP 11/07/2013   General:  Well-developed,well-nourished,in no acute distress; alert,appropriate and cooperative throughout examination Head:  normocephalic and atraumatic.   Eyes:  vision grossly intact, pupils equal, pupils round, and pupils reactive to light.   Ears:  R ear normal and L ear normal.   Nose:  no external deformity.   Mouth:  good dentition.   Neck:  No deformities, masses, or tenderness noted. Breasts:  No mass, nodules, thickening, tenderness, bulging, retraction, inflamation, nipple discharge or skin changes noted.   Lungs:  Normal respiratory effort, chest expands symmetrically. Lungs are clear to auscultation, no crackles or wheezes. Heart:  Normal rate and regular rhythm.  S1 and S2 normal without gallop, murmur, click, rub or other extra sounds. Abdomen:  Bowel sounds positive,abdomen soft and non-tender without masses, organomegaly or hernias noted. Rectal:  no external abnormalities.   Genitalia:  Pelvic Exam:        External: normal female genitalia without lesions or masses        Vagina: normal without lesions or masses        Cervix: normal without lesions or masses        Adnexa: normal bimanual exam without masses or fullness        Uterus: normal by palpation        Pap smear: performed Msk:  No deformity or scoliosis noted of thoracic or  lumbar spine.   Extremities:  No clubbing, cyanosis, edema, or deformity noted with normal full range of motion of all joints.   Neurologic:  alert & oriented X3 and gait normal.   Skin:  Intact without suspicious lesions or rashes Cervical Nodes:  No lymphadenopathy noted Axillary Nodes:  No palpable lymphadenopathy Psych:  Cognition and judgment appear intact. Alert and cooperative with normal attention span and concentration. No apparent delusions, illusions, hallucinations     Assessment & Plan:

## 2015-09-14 NOTE — Assessment & Plan Note (Signed)
Explained to pt that it is dangerous to be followed by two providers. I urged her to call us to let us know what she is taking. She is following up with Dr. Alessandra BevelsVaughn monthly. Check labs today and will forward to her as well.

## 2015-09-14 NOTE — Addendum Note (Signed)
Addended by: Desmond DikeKNIGHT, Areil Ottey H on: 09/14/2015 12:56 PM   Modules accepted: Orders

## 2015-09-14 NOTE — Assessment & Plan Note (Signed)
Reviewed preventive care protocols, scheduled due services, and updated immunizations Discussed nutrition, exercise, diet, and healthy lifestyle.  Orders Placed This Encounter  Procedures  . CBC with Differential/Platelet  . Comprehensive metabolic panel  . Lipid panel  . TSH   Pap today.

## 2015-09-14 NOTE — Addendum Note (Signed)
Addended by: Dianne DunARON, Charelle Petrakis M on: 09/14/2015 01:00 PM   Modules accepted: Orders, SmartSet

## 2015-09-14 NOTE — Progress Notes (Signed)
Pre visit review using our clinic review tool, if applicable. No additional management support is needed unless otherwise documented below in the visit note. 

## 2015-09-15 ENCOUNTER — Encounter: Payer: Self-pay | Admitting: *Deleted

## 2015-09-16 LAB — CYTOLOGY - PAP

## 2015-09-17 LAB — CERVICOVAGINAL ANCILLARY ONLY
Bacterial vaginitis: POSITIVE — AB
CANDIDA VAGINITIS: NEGATIVE
Herpes: NEGATIVE

## 2015-09-20 ENCOUNTER — Ambulatory Visit
Admission: RE | Admit: 2015-09-20 | Discharge: 2015-09-20 | Disposition: A | Discharge status: Home / Self Care | Payer: BLUE CROSS/BLUE SHIELD | Source: Ambulatory Visit | Attending: Family Medicine | Admitting: Family Medicine

## 2015-09-20 DIAGNOSIS — Z79890 Hormone replacement therapy (postmenopausal): Secondary | ICD-10-CM

## 2015-09-20 DIAGNOSIS — Z803 Family history of malignant neoplasm of breast: Secondary | ICD-10-CM

## 2015-09-20 MED ORDER — METRONIDAZOLE 500 MG PO TABS: 500.0000 mg | ORAL_TABLET | Freq: Two times a day (BID) | ORAL | Status: DC

## 2015-09-20 NOTE — Addendum Note (Signed)
Addended by: Desmond DikeKNIGHT, Slyvia Lartigue H on: 09/20/2015 04:38 PM   Modules accepted: Orders

## 2015-10-29 DIAGNOSIS — M9903 Segmental and somatic dysfunction of lumbar region: Secondary | ICD-10-CM | POA: Diagnosis not present

## 2015-10-29 DIAGNOSIS — M545 Low back pain: Secondary | ICD-10-CM | POA: Diagnosis not present

## 2015-10-29 DIAGNOSIS — M5387 Other specified dorsopathies, lumbosacral region: Secondary | ICD-10-CM | POA: Diagnosis not present

## 2015-10-29 DIAGNOSIS — M5417 Radiculopathy, lumbosacral region: Secondary | ICD-10-CM | POA: Diagnosis not present

## 2015-11-01 DIAGNOSIS — E509 Vitamin A deficiency, unspecified: Secondary | ICD-10-CM | POA: Diagnosis not present

## 2015-11-01 DIAGNOSIS — E559 Vitamin D deficiency, unspecified: Secondary | ICD-10-CM | POA: Diagnosis not present

## 2015-11-01 DIAGNOSIS — R739 Hyperglycemia, unspecified: Secondary | ICD-10-CM | POA: Diagnosis not present

## 2015-11-01 DIAGNOSIS — D519 Vitamin B12 deficiency anemia, unspecified: Secondary | ICD-10-CM | POA: Diagnosis not present

## 2016-01-25 ENCOUNTER — Other Ambulatory Visit: Payer: Self-pay | Admitting: Family Medicine

## 2016-01-25 DIAGNOSIS — Z1231 Encounter for screening mammogram for malignant neoplasm of breast: Secondary | ICD-10-CM

## 2016-01-31 DIAGNOSIS — E7210 Disorders of sulfur-bearing amino-acid metabolism, unspecified: Secondary | ICD-10-CM | POA: Diagnosis not present

## 2016-01-31 DIAGNOSIS — D509 Iron deficiency anemia, unspecified: Secondary | ICD-10-CM | POA: Diagnosis not present

## 2016-01-31 DIAGNOSIS — E559 Vitamin D deficiency, unspecified: Secondary | ICD-10-CM | POA: Diagnosis not present

## 2016-01-31 DIAGNOSIS — E039 Hypothyroidism, unspecified: Secondary | ICD-10-CM | POA: Diagnosis not present

## 2016-02-02 DIAGNOSIS — H524 Presbyopia: Secondary | ICD-10-CM | POA: Diagnosis not present

## 2016-04-06 ENCOUNTER — Ambulatory Visit (INDEPENDENT_AMBULATORY_CARE_PROVIDER_SITE_OTHER): Payer: BLUE CROSS/BLUE SHIELD | Admitting: Primary Care

## 2016-04-06 ENCOUNTER — Encounter: Payer: Self-pay | Admitting: Primary Care

## 2016-04-06 VITALS — BP 124/70 | HR 73 | Temp 98.8°F | Ht 67.0 in | Wt 164.0 lb

## 2016-04-06 DIAGNOSIS — M542 Cervicalgia: Secondary | ICD-10-CM | POA: Diagnosis not present

## 2016-04-06 MED ORDER — METHOCARBAMOL 500 MG PO TABS: 500.0000 mg | ORAL_TABLET | Freq: Three times a day (TID) | ORAL | 0 refills | Status: DC | PRN

## 2016-04-06 NOTE — Patient Instructions (Signed)
You may take methocarbamol (muscle relaxer) every 8 hours as needed for muscle spasms and neck pain. Start by taking this at bedtime.  Start Ibuprofen. Take 600-800 mg every 8 hours as needed for neck and shoulder pain.  Refrain from remaining to still. stretch your neck and shoulders as discussed. Use a heating pad as discussed.   You will feel very sore Friday and Saturday and may not feel your usual self for several weeks.  Please notify me if you develop numbness/tinlging, severe headaches, feel worse.  It was a pleasure meeting you!
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
## 2016-04-14 ENCOUNTER — Telehealth: Payer: Self-pay | Admitting: Family Medicine

## 2016-04-14 ENCOUNTER — Other Ambulatory Visit: Payer: Self-pay | Admitting: Internal Medicine

## 2016-04-14 MED ORDER — VALACYCLOVIR HCL 500 MG PO TABS: 500.0000 mg | ORAL_TABLET | Freq: Two times a day (BID) | ORAL | 0 refills | Status: DC | PRN

## 2016-04-14 NOTE — Telephone Encounter (Signed)
Medication sent to pharmacy  

## 2016-04-14 NOTE — Telephone Encounter (Signed)
Pt called in to request a refill of her Zalacyclozir 500mg .  Can you please reiview this?

## 2016-04-24 DIAGNOSIS — M545 Low back pain: Secondary | ICD-10-CM | POA: Diagnosis not present

## 2016-04-24 DIAGNOSIS — M5387 Other specified dorsopathies, lumbosacral region: Secondary | ICD-10-CM | POA: Diagnosis not present

## 2016-04-24 DIAGNOSIS — M9903 Segmental and somatic dysfunction of lumbar region: Secondary | ICD-10-CM | POA: Diagnosis not present

## 2016-04-24 DIAGNOSIS — M5417 Radiculopathy, lumbosacral region: Secondary | ICD-10-CM | POA: Diagnosis not present

## 2016-04-26 DIAGNOSIS — E559 Vitamin D deficiency, unspecified: Secondary | ICD-10-CM | POA: Diagnosis not present

## 2016-04-26 DIAGNOSIS — D508 Other iron deficiency anemias: Secondary | ICD-10-CM | POA: Diagnosis not present

## 2016-04-26 DIAGNOSIS — E7210 Disorders of sulfur-bearing amino-acid metabolism, unspecified: Secondary | ICD-10-CM | POA: Diagnosis not present

## 2016-04-26 DIAGNOSIS — E039 Hypothyroidism, unspecified: Secondary | ICD-10-CM | POA: Diagnosis not present

## 2016-06-22 ENCOUNTER — Ambulatory Visit: Payer: BLUE CROSS/BLUE SHIELD | Attending: Family Medicine

## 2016-07-17 ENCOUNTER — Ambulatory Visit (INDEPENDENT_AMBULATORY_CARE_PROVIDER_SITE_OTHER)
Admission: RE | Admit: 2016-07-17 | Discharge: 2016-07-17 | Disposition: A | Discharge status: Home / Self Care | Payer: BLUE CROSS/BLUE SHIELD | Source: Ambulatory Visit | Attending: Family Medicine | Admitting: Family Medicine

## 2016-07-17 ENCOUNTER — Other Ambulatory Visit: Payer: Self-pay | Admitting: Family Medicine

## 2016-07-17 ENCOUNTER — Ambulatory Visit (INDEPENDENT_AMBULATORY_CARE_PROVIDER_SITE_OTHER): Payer: BLUE CROSS/BLUE SHIELD | Admitting: Family Medicine

## 2016-07-17 ENCOUNTER — Encounter: Payer: Self-pay | Admitting: Family Medicine

## 2016-07-17 DIAGNOSIS — M79641 Pain in right hand: Secondary | ICD-10-CM

## 2016-07-17 DIAGNOSIS — M6530 Trigger finger, unspecified finger: Secondary | ICD-10-CM

## 2016-07-17 DIAGNOSIS — M20011 Mallet finger of right finger(s): Secondary | ICD-10-CM

## 2016-07-17 NOTE — Progress Notes (Signed)
Pre visit review using our clinic review tool, if applicable. No additional management support is needed unless otherwise documented below in the visit note. 

## 2016-07-17 NOTE — Patient Instructions (Signed)
Trigger Finger  Trigger finger (digital tendinitis and stenosing tenosynovitis) is a common disorder that causes an often painful catching of the fingers or thumb. It occurs as a clicking, snapping, or locking of a finger in the palm of the hand. This is caused by a problem with the tendons that flex or bend the fingers sliding smoothly through their sheaths. The condition may occur in any finger or a couple fingers at the same time.   The finger may lock with the finger curled or suddenly straighten out with a snap. This is more common in patients with rheumatoid arthritis and diabetes. Left untreated, the condition may get worse to the point where the finger becomes locked in flexion, like making a fist, or less commonly locked with the finger straightened out.  CAUSES    Inflammation and scarring that lead to swelling around the tendon sheath.   Repeated or forceful movements.   Rheumatoid arthritis, an autoimmune disease that affects joints.   Gout.   Diabetes mellitus.  SIGNS AND SYMPTOMS   Soreness and swelling of your finger.   A painful clicking or snapping as you bend and straighten your finger.  DIAGNOSIS   Your health care provider will do a physical exam of your finger to diagnose trigger finger.  TREATMENT    Splinting for 6-8 weeks may be helpful.   Nonsteroidal anti-inflammatory medicines (NSAIDs) can help to relieve the pain and inflammation.   Cortisone injections, along with splinting, may speed up recovery. Several injections may be required. Cortisone may give relief after one injection.   Surgery is another treatment that may be used if conservative treatments do not work. Surgery can be minor, without incisions (a cut does not have to be made), and can be done with a needle through the skin.   Other surgical choices involve an open procedure in which the surgeon opens the hand through a small incision and cuts the pulley so the tendon can again slide smoothly. Your hand will still  work fine.  HOME CARE INSTRUCTIONS   Apply ice to the injured area, twice per day:    Put ice in a plastic bag.    Place a towel between your skin and the bag.    Leave the ice on for 20 minutes, 3-4 times a day.   Rest your hand often.  MAKE SURE YOU:    Understand these instructions.   Will watch your condition.   Will get help right away if you are not doing well or get worse.     This information is not intended to replace advice given to you by your health care provider. Make sure you discuss any questions you have with your health care provider.     Document Released: 04/15/2004 Document Revised: 02/26/2013 Document Reviewed: 11/26/2012  Elsevier Interactive Patient Education 2017 Elsevier Inc.

## 2016-07-17 NOTE — Progress Notes (Addendum)
   Subjective:   Patient ID: Debra Wheeler, female    DOB: July 22, 1965, 51 y.o.   MRN: [id number]  Debra Wheeler is a pleasant 51 y.o. year old female who presents to clinic today with Hand Pain (right hand pinky finger. denies injury)  on 07/17/2016  HPI:  Pain and clicking/sticking of right 5th digit.  Ongoing for over a year.  Has more ROM now but also now more pain and discomfort with it.  No known injury.  Does use that finger a lot while typing at work.  Current Outpatient Prescriptions on File Prior to Visit  Medication Sig Dispense Refill  . valACYclovir (VALTREX) 500 MG tablet Take 1 tablet (500 mg total) by mouth 2 (two) times daily as needed. 30 tablet 0   No current facility-administered medications on file prior to visit.     Allergies  Allergen Reactions  . Penicillins Rash    No past medical history on file.  No past surgical history on file.  Family History  Problem Relation Age of Onset  . Cancer Sister 69    breast- BRCA positive  . Breast cancer Sister   . Breast cancer Mother     Social History   Social History  . Marital status: Married    Spouse name: N/A  . Number of children: 2  . Years of education: N/A   Occupational History  . Not on file.   Social History Main Topics  . Smoking status: Never Smoker  . Smokeless tobacco: Not on file  . Alcohol use Not on file  . Drug use: Unknown  . Sexual activity: Not on file   Other Topics Concern  . Not on file   Social History Narrative  . No narrative on file   The PMH, PSH, Social History, Family History, Medications, and allergies have been reviewed in [facility], and have been updated if relevant.  Review of Systems  Musculoskeletal: Positive for arthralgias and joint swelling.  All other systems reviewed and are negative.      Objective:    BP 104/66   Pulse 67   Temp 97.8 F (36.6 C) (Oral)   Wt 169 lb (76.7 kg)   LMP 11/07/2013   SpO2 98%   BMI 26.47 kg/m     Physical Exam  Constitutional: She is oriented to person, place, and time. She appears well-developed and well-nourished. No distress.  HENT:  Head: Normocephalic.  Eyes: Conjunctivae are normal.  Cardiovascular: Normal rate.   Pulmonary/Chest: Effort normal.  Musculoskeletal:       Right hand: She exhibits decreased range of motion.  Hyperflexion of right fifth digit/mallet finger, audible clicking  Neurological: She is alert and oriented to person, place, and time.  Skin: Skin is warm and dry. She is not diaphoretic.  Psychiatric: She has a normal mood and affect. Her behavior is normal. Judgment and thought content normal.  Nursing note and vitals reviewed.         Assessment & Plan:   Right hand pain - Plan: DG Hand Complete Right  Trigger finger, acquired No Follow-up on file.

## 2016-07-17 NOTE — Assessment & Plan Note (Signed)
Ongoing for over a year which makes it more complicated. Xray of hand today and then will likely need to refer to ortho. The patient indicates understanding of these issues and agrees with the plan.

## 2016-08-02 DIAGNOSIS — S63639A Sprain of interphalangeal joint of unspecified finger, initial encounter: Secondary | ICD-10-CM | POA: Diagnosis not present

## 2016-08-02 DIAGNOSIS — M79644 Pain in right finger(s): Secondary | ICD-10-CM | POA: Diagnosis not present

## 2016-08-16 ENCOUNTER — Ambulatory Visit
Admission: RE | Admit: 2016-08-16 | Discharge: 2016-08-16 | Disposition: A | Discharge status: Home / Self Care | Payer: BLUE CROSS/BLUE SHIELD | Source: Ambulatory Visit | Attending: Family Medicine | Admitting: Family Medicine

## 2016-08-16 DIAGNOSIS — Z1231 Encounter for screening mammogram for malignant neoplasm of breast: Secondary | ICD-10-CM | POA: Diagnosis not present

## 2016-08-17 ENCOUNTER — Telehealth: Payer: Self-pay | Admitting: Family Medicine

## 2016-08-17 NOTE — Telephone Encounter (Signed)
I would personally not feel comfortable prescribing HRT given her family history of breast and colon CA.  I would be happy to refer her to a specialist, a gynecologist, to discuss further.

## 2016-08-17 NOTE — Telephone Encounter (Signed)
Patient called to ask if Dr.Aron would prescribe HRT.  Patient said she's in menopause and wants to know if Dr.Aron would prescribe Bio Identical Hormone Replacement.  Patient said it's new and it's a cream.  Patient has been doing research. Patient has severe hot flashes,headaches,mood swings,trouble sleeping,body aches. Please advise.

## 2016-08-18 NOTE — Telephone Encounter (Signed)
Lm on pts vm requesting a call back 

## 2016-08-22 DIAGNOSIS — Z124 Encounter for screening for malignant neoplasm of cervix: Secondary | ICD-10-CM | POA: Diagnosis not present

## 2016-08-22 DIAGNOSIS — Z01419 Encounter for gynecological examination (general) (routine) without abnormal findings: Secondary | ICD-10-CM | POA: Diagnosis not present

## 2016-08-22 DIAGNOSIS — N959 Unspecified menopausal and perimenopausal disorder: Secondary | ICD-10-CM | POA: Diagnosis not present

## 2016-08-22 DIAGNOSIS — N898 Other specified noninflammatory disorders of vagina: Secondary | ICD-10-CM | POA: Diagnosis not present

## 2016-08-22 NOTE — Telephone Encounter (Signed)
Lm on pts vm requesting a call back 

## 2016-08-23 NOTE — Telephone Encounter (Signed)
Lm on pts vm requesting a call back 

## 2016-09-02 ENCOUNTER — Other Ambulatory Visit: Payer: Self-pay | Admitting: Family Medicine

## 2016-09-02 DIAGNOSIS — E039 Hypothyroidism, unspecified: Secondary | ICD-10-CM

## 2016-09-02 DIAGNOSIS — Z01419 Encounter for gynecological examination (general) (routine) without abnormal findings: Secondary | ICD-10-CM

## 2016-09-04 DIAGNOSIS — S63639D Sprain of interphalangeal joint of unspecified finger, subsequent encounter: Secondary | ICD-10-CM | POA: Diagnosis not present

## 2016-09-04 DIAGNOSIS — M79644 Pain in right finger(s): Secondary | ICD-10-CM | POA: Diagnosis not present

## 2016-09-08 ENCOUNTER — Other Ambulatory Visit: Payer: BLUE CROSS/BLUE SHIELD

## 2016-09-14 ENCOUNTER — Encounter: Payer: Self-pay | Admitting: Family Medicine

## 2016-09-14 ENCOUNTER — Ambulatory Visit (INDEPENDENT_AMBULATORY_CARE_PROVIDER_SITE_OTHER): Payer: BLUE CROSS/BLUE SHIELD | Admitting: Family Medicine

## 2016-09-14 VITALS — BP 112/82 | HR 87 | Temp 98.2°F | Ht 67.5 in | Wt 158.0 lb

## 2016-09-14 DIAGNOSIS — E039 Hypothyroidism, unspecified: Secondary | ICD-10-CM | POA: Diagnosis not present

## 2016-09-14 DIAGNOSIS — Z800 Family history of malignant neoplasm of digestive organs: Secondary | ICD-10-CM | POA: Diagnosis not present

## 2016-09-14 DIAGNOSIS — Z0000 Encounter for general adult medical examination without abnormal findings: Secondary | ICD-10-CM

## 2016-09-14 DIAGNOSIS — N959 Unspecified menopausal and perimenopausal disorder: Secondary | ICD-10-CM | POA: Diagnosis not present

## 2016-09-14 DIAGNOSIS — Z79890 Hormone replacement therapy (postmenopausal): Secondary | ICD-10-CM | POA: Diagnosis not present

## 2016-09-14 DIAGNOSIS — Z803 Family history of malignant neoplasm of breast: Secondary | ICD-10-CM

## 2016-09-14 DIAGNOSIS — Z01419 Encounter for gynecological examination (general) (routine) without abnormal findings: Secondary | ICD-10-CM

## 2016-09-14 LAB — LIPID PANEL
CHOLESTEROL: 192 mg/dL (ref 0–200)
HDL: 59.1 mg/dL (ref >39.00)
LDL Cholesterol: 122 mg/dL — ABNORMAL HIGH (ref 0–99)
NonHDL: 132.54
TRIGLYCERIDES: 53 mg/dL (ref 0.0–149.0)
Total CHOL/HDL Ratio: 3
VLDL: 10.6 mg/dL (ref 0.0–40.0)

## 2016-09-14 LAB — COMPREHENSIVE METABOLIC PANEL
ALBUMIN: 4.5 g/dL (ref 3.5–5.2)
ALK PHOS: 56 U/L (ref 39–117)
ALT: 15 U/L (ref 0–35)
AST: 19 U/L (ref 0–37)
BILIRUBIN TOTAL: 0.5 mg/dL (ref 0.2–1.2)
BUN: 13 mg/dL (ref 6–23)
CALCIUM: 10 mg/dL (ref 8.4–10.5)
CO2: 31 mEq/L (ref 19–32)
CREATININE: 1.06 mg/dL (ref 0.40–1.20)
Chloride: 104 mEq/L (ref 96–112)
GFR: 58.72 mL/min — ABNORMAL LOW (ref >60.00)
Glucose, Bld: 106 mg/dL — ABNORMAL HIGH (ref 70–99)
Potassium: 3.6 mEq/L (ref 3.5–5.1)
Sodium: 140 mEq/L (ref 135–145)
TOTAL PROTEIN: 7.2 g/dL (ref 6.0–8.3)

## 2016-09-14 LAB — CBC WITH DIFFERENTIAL/PLATELET
BASOS ABS: 0 10*3/uL (ref 0.0–0.1)
BASOS PCT: 0.9 % (ref 0.0–3.0)
EOS ABS: 0.1 10*3/uL (ref 0.0–0.7)
Eosinophils Relative: 1.4 % (ref 0.0–5.0)
HEMATOCRIT: 43.8 % (ref 36.0–46.0)
HEMOGLOBIN: 14.6 g/dL (ref 12.0–15.0)
LYMPHS PCT: 27.5 % (ref 12.0–46.0)
Lymphs Abs: 1.2 10*3/uL (ref 0.7–4.0)
MCHC: 33.2 g/dL (ref 30.0–36.0)
MCV: 91.2 fl (ref 78.0–100.0)
Monocytes Absolute: 0.4 10*3/uL (ref 0.1–1.0)
Monocytes Relative: 8 % (ref 3.0–12.0)
Neutro Abs: 2.7 10*3/uL (ref 1.4–7.7)
Neutrophils Relative %: 62.2 % (ref 43.0–77.0)
Platelets: 280 10*3/uL (ref 150.0–400.0)
RBC: 4.81 Mil/uL (ref 3.87–5.11)
RDW: 13.1 % (ref 11.5–15.5)
WBC: 4.4 10*3/uL (ref 4.0–10.5)

## 2016-09-14 LAB — VITAMIN D 25 HYDROXY (VIT D DEFICIENCY, FRACTURES): VITD: 34.08 ng/mL (ref 30.00–100.00)

## 2016-09-14 LAB — TSH: TSH: 1.81 u[IU]/mL (ref 0.35–4.50)

## 2016-09-14 LAB — VITAMIN B12: VITAMIN B 12: 739 pg/mL (ref 211–911)

## 2016-09-14 NOTE — Patient Instructions (Signed)
Great to see you. We will call you with your results from today. 

## 2016-09-14 NOTE — Progress Notes (Signed)
Pre visit review using our clinic review tool, if applicable. No additional management support is needed unless otherwise documented below in the visit note. 

## 2016-09-14 NOTE — Assessment & Plan Note (Signed)
Reviewed preventive care protocols, scheduled due services, and updated immunizations Discussed nutrition, exercise, diet, and healthy lifestyle.  Orders Placed This Encounter  Procedures  . CBC with Differential/Platelet  . Comprehensive metabolic panel  . Lipid panel  . TSH  . Vitamin D, 25-hydroxy  . Vitamin B12    

## 2016-09-14 NOTE — Progress Notes (Signed)
Subjective:   Patient ID: Debra Wheeler, female    DOB: Jan 19, 1966, 51 y.o.   MRN: [id number]  Debra Wheeler is a pleasant 51 y.o. year old female who presents to clinic today with Annual Exam (Sees GYN. Pap 08-30-16 Normal)  on 09/14/2016  HPI:  Has GYN- normal Pap on 08/30/16 Mammogram 08/21/16  Strong FH of breast cancer- mother died of breast CA at 55 yo. Sister just had mastectomy due to breast cancer at 49 yo. She had BRCA testing- BRCA1 neg, BRCA2 uncertain.  She is going today to see a specialist about bio identical hormones- Earnstine Regal.  Has been seeing Dr. Fredna Dow for volar plate injury of the finger.  Lab Results  Component Value Date   CHOL 196 09/14/2015   HDL 73.40 09/14/2015   LDLCALC 112 (H) 09/14/2015   TRIG 55.0 09/14/2015   CHOLHDL 3 09/14/2015   Lab Results  Component Value Date   CREATININE 0.82 09/14/2015   Lab Results  Component Value Date   WBC 6.3 09/14/2015   HGB 13.1 09/14/2015   HCT 39.5 09/14/2015   MCV 90.4 09/14/2015   PLT 276.0 09/14/2015   Lab Results  Component Value Date   TSH 1.14 09/14/2015   Lab Results  Component Value Date   NA 140 09/14/2015   K 3.9 09/14/2015   CL 103 09/14/2015   CO2 28 09/14/2015   Current Outpatient Prescriptions on File Prior to Visit  Medication Sig Dispense Refill  . valACYclovir (VALTREX) 500 MG tablet Take 1 tablet (500 mg total) by mouth 2 (two) times daily as needed. 30 tablet 0   No current facility-administered medications on file prior to visit.     Allergies  Allergen Reactions  . Penicillins Rash    No past medical history on file.  No past surgical history on file.  Family History  Problem Relation Age of Onset  . Cancer Sister 69    breast- BRCA positive  . Breast cancer Sister 3  . Breast cancer Mother 54    Social History   Social History  . Marital status: Married    Spouse name: N/A  . Number of children: 2  . Years of education: N/A   Occupational  History  . Not on file.   Social History Main Topics  . Smoking status: Never Smoker  . Smokeless tobacco: Never Used  . Alcohol use Not on file  . Drug use: Unknown  . Sexual activity: Not on file   Other Topics Concern  . Not on file   Social History Narrative  . No narrative on file   The PMH, PSH, Social History, Family History, Medications, and allergies have been reviewed in [facility], and have been updated if relevant.  Review of Systems  Constitutional: Negative.   HENT: Negative.   Eyes: Negative.   Respiratory: Negative.   Gastrointestinal: Negative.   Endocrine: Positive for heat intolerance.  Genitourinary: Negative.   Musculoskeletal: Negative.   Allergic/Immunologic: Negative.   Neurological: Negative.   Hematological: Negative.   Psychiatric/Behavioral: Negative.   All other systems reviewed and are negative.      Objective:    BP 112/82 (BP Location: Left Arm, Patient Position: Sitting, Cuff Size: Normal)   Pulse 87   Temp 98.2 F (36.8 C) (Oral)   Ht 5' 7.5" (1.715 m)   Wt 158 lb (71.7 kg)   LMP 11/07/2013   SpO2 96%   BMI 24.38 kg/m    Physical  Exam  General:  Well-developed,well-nourished,in no acute distress; alert,appropriate and cooperative throughout examination Head:  normocephalic and atraumatic.   Eyes:  vision grossly intact, PERRL Ears:  R ear normal and L ear normal externally, TMs clear bilaterally Nose:  no external deformity.   Mouth:  good dentition.   Neck:  No deformities, masses, or tenderness noted.  Lungs:  Normal respiratory effort, chest expands symmetrically. Lungs are clear to auscultation, no crackles or wheezes. Heart:  Normal rate and regular rhythm. S1 and S2 normal without gallop, murmur, click, rub or other extra sounds. Abdomen:  Bowel sounds positive,abdomen soft and non-tender without masses, organomegaly or hernias noted. Msk:  No deformity or scoliosis noted of thoracic or lumbar spine.   Extremities:  No  clubbing, cyanosis, edema, or deformity noted with normal full range of motion of all joints.   Neurologic:  alert & oriented X3 and gait normal.   Skin:  Intact without suspicious lesions or rashes Cervical Nodes:  No lymphadenopathy noted Axillary Nodes:  No palpable lymphadenopathy Psych:  Cognition and judgment appear intact. Alert and cooperative with normal attention span and concentration. No apparent delusions, illusions, hallucinations        Assessment & Plan:   Well woman exam  Postmenopausal HRT (hormone replacement therapy)  Hypothyroidism, unspecified type  Family history of colon cancer  Family history of breast cancer No Follow-up on file.

## 2016-10-19 DIAGNOSIS — M8580 Other specified disorders of bone density and structure, unspecified site: Secondary | ICD-10-CM | POA: Diagnosis not present

## 2016-10-19 DIAGNOSIS — N951 Menopausal and female climacteric states: Secondary | ICD-10-CM | POA: Diagnosis not present

## 2016-11-21 DIAGNOSIS — K602 Anal fissure, unspecified: Secondary | ICD-10-CM | POA: Diagnosis not present

## 2016-11-21 DIAGNOSIS — R140 Abdominal distension (gaseous): Secondary | ICD-10-CM | POA: Diagnosis not present

## 2016-11-21 DIAGNOSIS — K5904 Chronic idiopathic constipation: Secondary | ICD-10-CM | POA: Diagnosis not present

## 2016-11-21 DIAGNOSIS — K625 Hemorrhage of anus and rectum: Secondary | ICD-10-CM | POA: Diagnosis not present

## 2016-11-27 ENCOUNTER — Encounter: Payer: Self-pay | Admitting: Family Medicine

## 2016-11-27 DIAGNOSIS — Z1211 Encounter for screening for malignant neoplasm of colon: Secondary | ICD-10-CM | POA: Diagnosis not present

## 2016-11-27 DIAGNOSIS — K625 Hemorrhage of anus and rectum: Secondary | ICD-10-CM | POA: Diagnosis not present

## 2016-11-27 DIAGNOSIS — D122 Benign neoplasm of ascending colon: Secondary | ICD-10-CM | POA: Diagnosis not present

## 2016-11-27 DIAGNOSIS — Z8371 Family history of colonic polyps: Secondary | ICD-10-CM | POA: Diagnosis not present

## 2017-02-08 ENCOUNTER — Other Ambulatory Visit: Payer: BLUE CROSS/BLUE SHIELD

## 2017-04-04 DIAGNOSIS — Z79890 Hormone replacement therapy (postmenopausal): Secondary | ICD-10-CM | POA: Diagnosis not present

## 2017-04-04 DIAGNOSIS — N951 Menopausal and female climacteric states: Secondary | ICD-10-CM | POA: Diagnosis not present

## 2017-04-30 DIAGNOSIS — E611 Iron deficiency: Secondary | ICD-10-CM | POA: Diagnosis not present

## 2017-04-30 DIAGNOSIS — E039 Hypothyroidism, unspecified: Secondary | ICD-10-CM | POA: Diagnosis not present

## 2017-04-30 DIAGNOSIS — R739 Hyperglycemia, unspecified: Secondary | ICD-10-CM | POA: Diagnosis not present

## 2017-04-30 DIAGNOSIS — E785 Hyperlipidemia, unspecified: Secondary | ICD-10-CM | POA: Diagnosis not present

## 2017-08-28 DIAGNOSIS — M25511 Pain in right shoulder: Secondary | ICD-10-CM | POA: Diagnosis not present

## 2017-08-28 DIAGNOSIS — K5900 Constipation, unspecified: Secondary | ICD-10-CM | POA: Diagnosis not present

## 2017-08-28 DIAGNOSIS — R140 Abdominal distension (gaseous): Secondary | ICD-10-CM | POA: Diagnosis not present

## 2017-08-28 DIAGNOSIS — R5383 Other fatigue: Secondary | ICD-10-CM | POA: Diagnosis not present

## 2017-08-28 DIAGNOSIS — F419 Anxiety disorder, unspecified: Secondary | ICD-10-CM | POA: Diagnosis not present

## 2017-08-28 DIAGNOSIS — R413 Other amnesia: Secondary | ICD-10-CM | POA: Diagnosis not present

## 2017-08-28 DIAGNOSIS — M2550 Pain in unspecified joint: Secondary | ICD-10-CM | POA: Diagnosis not present

## 2017-08-29 DIAGNOSIS — R140 Abdominal distension (gaseous): Secondary | ICD-10-CM | POA: Diagnosis not present

## 2017-08-29 DIAGNOSIS — R5383 Other fatigue: Secondary | ICD-10-CM | POA: Diagnosis not present

## 2017-08-29 DIAGNOSIS — M810 Age-related osteoporosis without current pathological fracture: Secondary | ICD-10-CM | POA: Diagnosis not present

## 2017-08-30 DIAGNOSIS — Z77120 Contact with and (suspected) exposure to mold (toxic): Secondary | ICD-10-CM | POA: Diagnosis not present

## 2017-08-30 DIAGNOSIS — F419 Anxiety disorder, unspecified: Secondary | ICD-10-CM | POA: Diagnosis not present

## 2017-08-30 DIAGNOSIS — R5383 Other fatigue: Secondary | ICD-10-CM | POA: Diagnosis not present

## 2017-08-30 DIAGNOSIS — M2550 Pain in unspecified joint: Secondary | ICD-10-CM | POA: Diagnosis not present

## 2017-09-14 DIAGNOSIS — R5383 Other fatigue: Secondary | ICD-10-CM | POA: Diagnosis not present

## 2017-09-14 DIAGNOSIS — R140 Abdominal distension (gaseous): Secondary | ICD-10-CM | POA: Diagnosis not present

## 2017-09-14 DIAGNOSIS — F419 Anxiety disorder, unspecified: Secondary | ICD-10-CM | POA: Diagnosis not present

## 2017-09-14 DIAGNOSIS — M25511 Pain in right shoulder: Secondary | ICD-10-CM | POA: Diagnosis not present

## 2017-10-02 ENCOUNTER — Ambulatory Visit
Admission: RE | Admit: 2017-10-02 | Discharge: 2017-10-02 | Disposition: A | Discharge status: Home / Self Care | Payer: BLUE CROSS/BLUE SHIELD | Source: Ambulatory Visit | Attending: Family Medicine | Admitting: Family Medicine

## 2017-10-02 ENCOUNTER — Other Ambulatory Visit: Payer: Self-pay | Admitting: Family Medicine

## 2017-10-02 DIAGNOSIS — Z1231 Encounter for screening mammogram for malignant neoplasm of breast: Secondary | ICD-10-CM

## 2018-08-18 IMAGING — MG MM DIGITAL SCREENING BILAT W/ CAD
4 series · 4 of 4 positions shown · non-contrast
Comparison: Previous exam(s).

CLINICAL DATA: Screening.

EXAM:
DIGITAL SCREENING BILATERAL MAMMOGRAM WITH CAD

[L MLO]
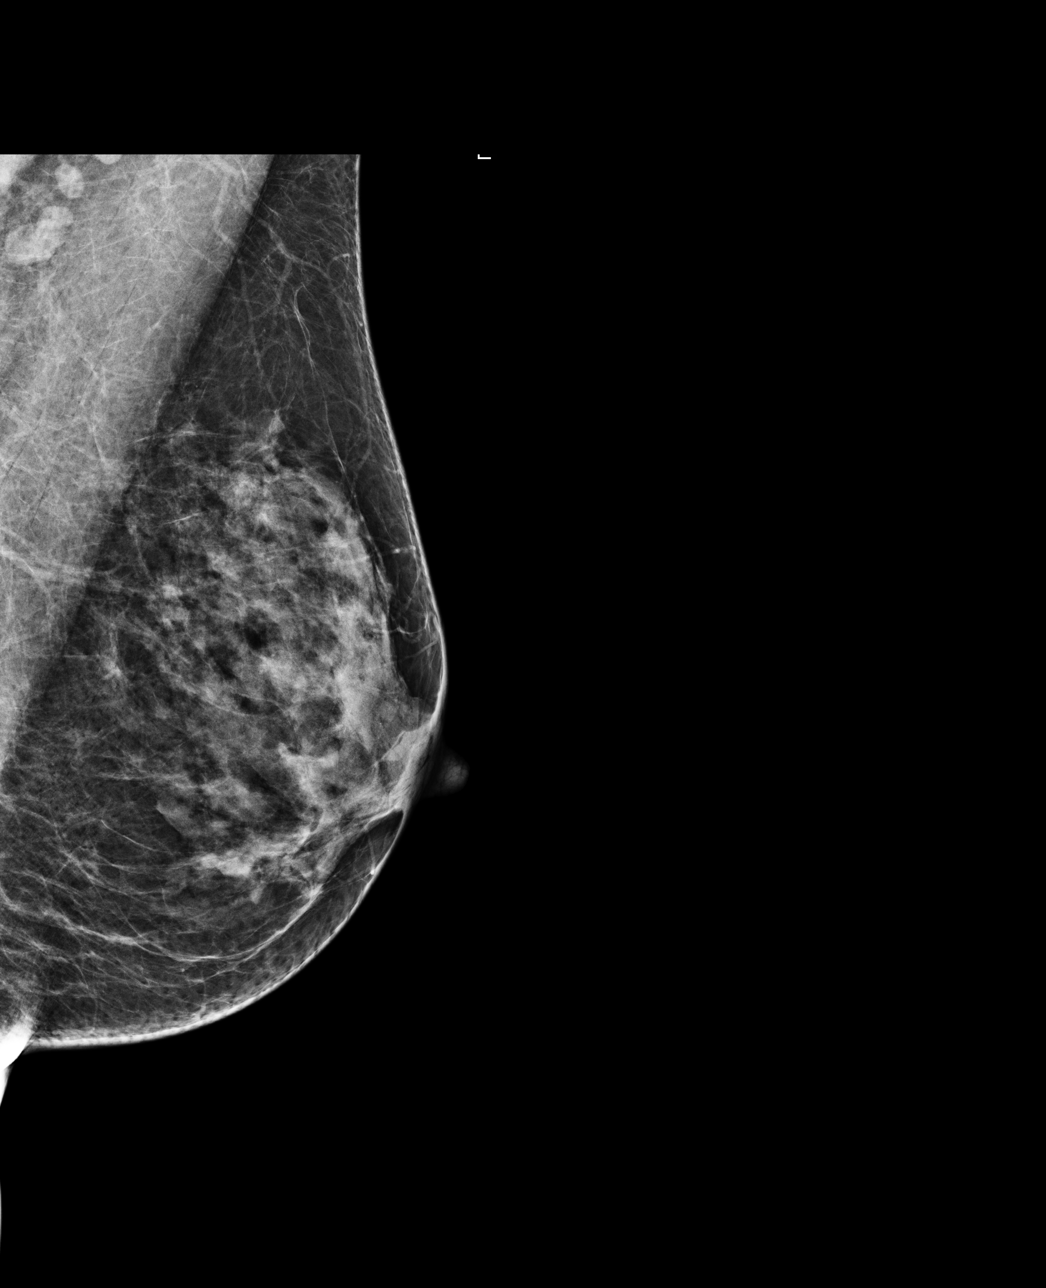

[L CC]
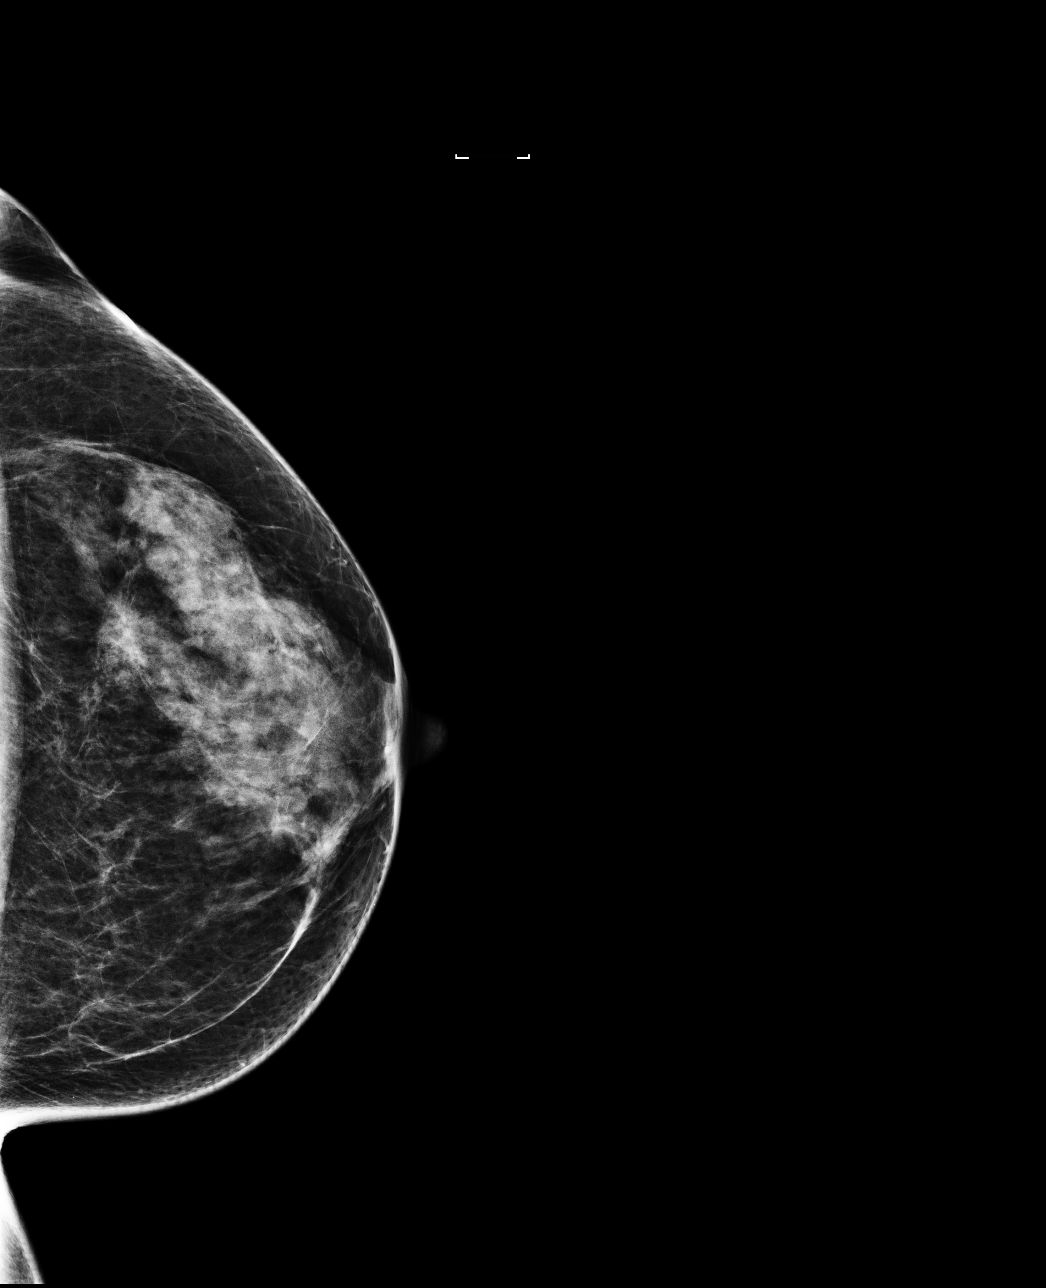

[R MLO]
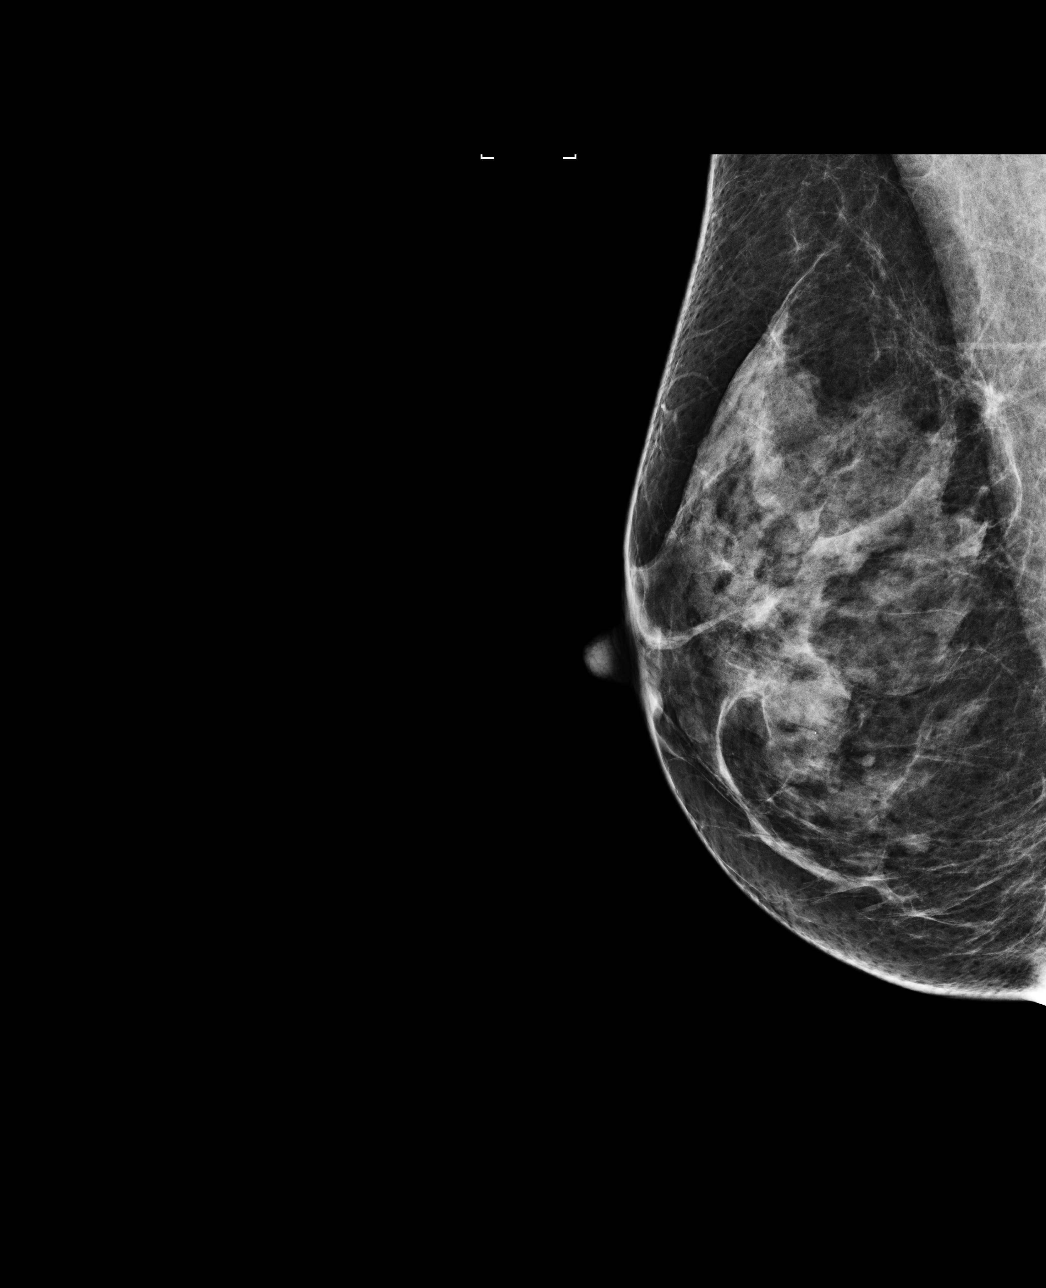

[R CC]
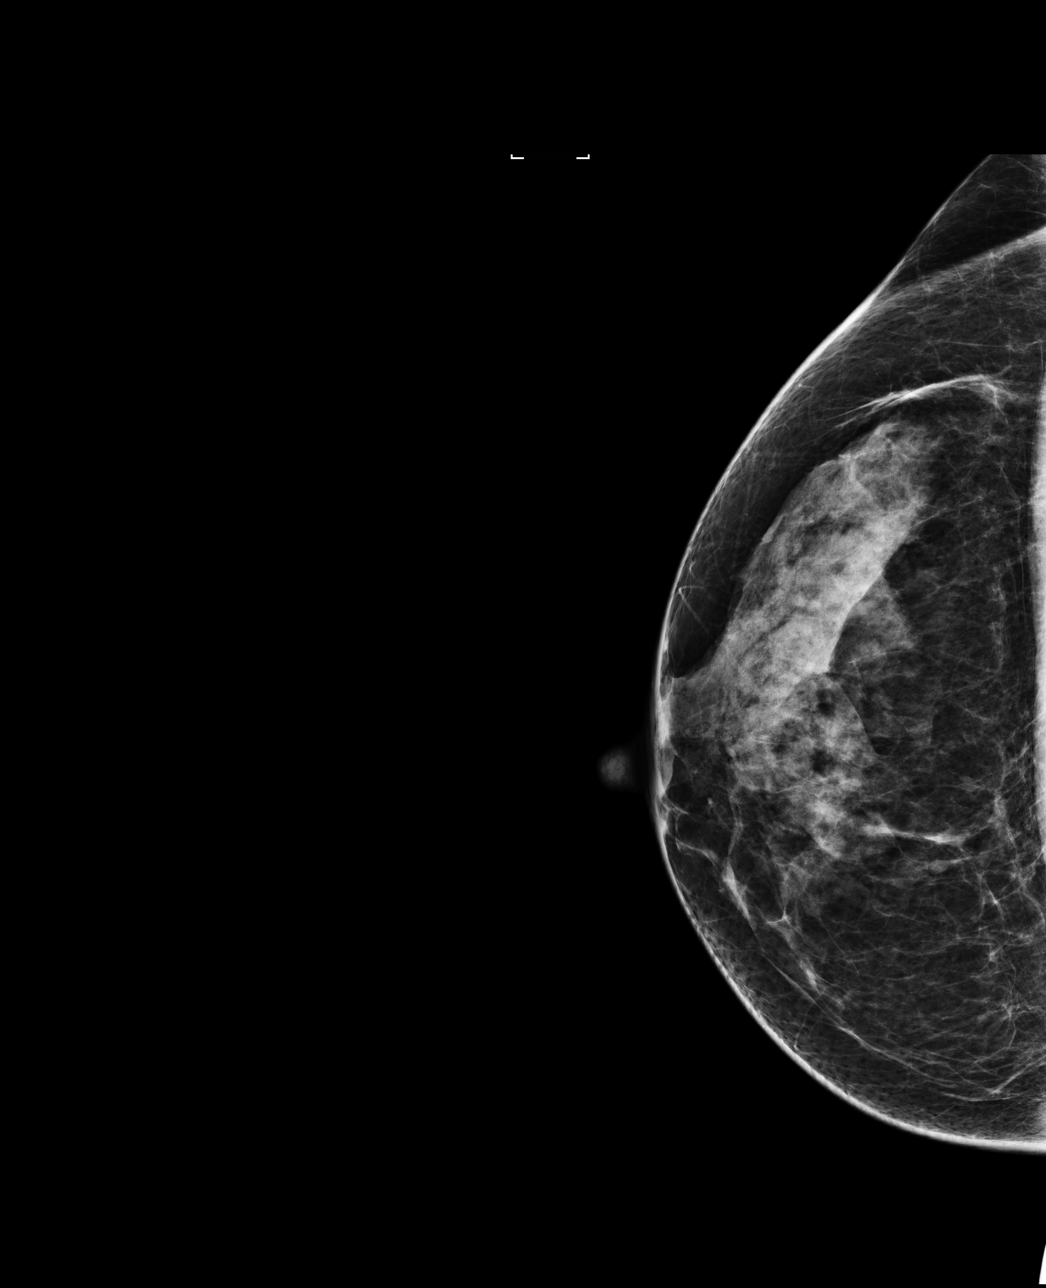

[4 of 4 positions shown; findings below may reference images not displayed]

ACR Breast Density Category c: The breast tissue is heterogeneously
dense, which may obscure small masses.
FINDINGS: There are no findings suspicious for malignancy. Images were
processed with CAD.
IMPRESSION: No mammographic evidence of malignancy. A result letter of this
screening mammogram will be mailed directly to the patient.

RECOMMENDATION:
Screening mammogram in one year. (Code:YJ-[id number])

BI-RADS CATEGORY  1: Negative.

## 2018-09-05 ENCOUNTER — Ambulatory Visit: Payer: BLUE CROSS/BLUE SHIELD | Admitting: Family Medicine

## 2018-09-05 ENCOUNTER — Encounter: Payer: Self-pay | Admitting: Family Medicine

## 2018-09-05 DIAGNOSIS — J069 Acute upper respiratory infection, unspecified: Secondary | ICD-10-CM | POA: Diagnosis not present

## 2018-09-05 MED ORDER — BENZONATATE 100 MG PO CAPS: 100.0000 mg | ORAL_CAPSULE | Freq: Three times a day (TID) | ORAL | 0 refills | Status: DC | PRN

## 2018-09-05 NOTE — Assessment & Plan Note (Signed)
Symptomatic therapy suggested: push fluids, rest, use vaporizer or mist prn and return office visit prn if symptoms persist or worsen. Lack of antibiotic effectiveness discussed with her. Call or return to clinic prn if these symptoms worsen or fail to improve as anticipated. Tessalon TID prn for cough.

## 2018-09-05 NOTE — Patient Instructions (Signed)

## 2018-09-05 NOTE — Progress Notes (Signed)
Debra Wheeler - 53 y.o. female MRN [id number]  Date of birth: Jan 01, 1966  Subjective Chief Complaint  Patient presents with  . Cough    ongoing for two days-denies body aches. Has been taking Motrin for her symptoms.     HPI  Debra Wheeler is a 53 y.o. female who complains of congestion, post nasal drip and dry cough for 6 days. She did have subjective fever and mild body aches when symptoms first started. She denies a history of chest pain, dizziness, fatigue, nausea, shortness of breath, vomiting, wheezing and sputum production and denies a history of asthma. Patient does not smoke cigarettes.  ROS:  A comprehensive ROS was completed and negative except as noted per HPI    Allergies  Allergen Reactions  . Penicillins Rash    No past medical history on file.  No past surgical history on file.  Social History   Socioeconomic History  . Marital status: Married    Spouse name: Not on file  . Number of children: 2  . Years of education: Not on file  . Highest education level: Not on file  Occupational History  . Not on file  Social Needs  . Financial resource strain: Not on file  . Food insecurity:    Worry: Not on file    Inability: Not on file  . Transportation needs:    Medical: Not on file    Non-medical: Not on file  Tobacco Use  . Smoking status: Never Smoker  . Smokeless tobacco: Never Used  Substance and Sexual Activity  . Alcohol use: Not on file  . Drug use: Not on file  . Sexual activity: Not on file  Lifestyle  . Physical activity:    Days per week: Not on file    Minutes per session: Not on file  . Stress: Not on file  Relationships  . Social connections:    Talks on phone: Not on file    Gets together: Not on file    Attends religious service: Not on file    Active member of club or organization: Not on file    Attends meetings of clubs or organizations: Not on file    Relationship status: Not on file  Other Topics Concern  . Not  on file  Social History Narrative  . Not on file    Family History  Problem Relation Age of Onset  . Cancer Sister 60       breast- BRCA positive  . Breast cancer Sister 55  . Breast cancer Mother 31    Health Maintenance  Topic Date Due  . HIV Screening  05/13/1983  . INFLUENZA VACCINE  02/07/2018  . TETANUS/TDAP  09/15/2026 (Originally 05/13/1987)  . PAP SMEAR-Modifier  08/31/2019  . MAMMOGRAM  10/03/2019  . COLONOSCOPY  11/28/2026    ----------------------------------------------------------------------------------------------------------------------------------------------------------------------------------------------------------------- Physical Exam BP 118/74   Pulse 74   Temp 98.1 F (36.7 C) (Oral)   Ht 5' 7.5" (1.715 m)   Wt 161 lb (73 kg)   LMP 11/07/2013   SpO2 98%   BMI 24.84 kg/m   Physical Exam Constitutional:      Appearance: Normal appearance. She is not ill-appearing.  HENT:     Head: Normocephalic and atraumatic.     Right Ear: Tympanic membrane normal.     Left Ear: Tympanic membrane normal.     Nose: Nose normal.     Mouth/Throat:     Mouth: Mucous membranes are moist.  Eyes:  General: No scleral icterus. Neck:     Musculoskeletal: Neck supple.  Cardiovascular:     Rate and Rhythm: Normal rate and regular rhythm.  Pulmonary:     Effort: Pulmonary effort is normal.     Breath sounds: Normal breath sounds.  Skin:    General: Skin is warm and dry.     Findings: No rash.  Neurological:     General: No focal deficit present.     Mental Status: She is alert.  Psychiatric:        Behavior: Behavior normal.     ------------------------------------------------------------------------------------------------------------------------------------------------------------------------------------------------------------------- Assessment and Plan  URI, acute Symptomatic therapy suggested: push fluids, rest, use vaporizer or mist prn and  return office visit prn if symptoms persist or worsen. Lack of antibiotic effectiveness discussed with her. Call or return to clinic prn if these symptoms worsen or fail to improve as anticipated. Tessalon TID prn for cough.

## 2018-09-10 ENCOUNTER — Ambulatory Visit: Payer: BLUE CROSS/BLUE SHIELD | Admitting: Family Medicine

## 2018-09-10 ENCOUNTER — Other Ambulatory Visit (HOSPITAL_COMMUNITY)
Admission: RE | Admit: 2018-09-10 | Discharge: 2018-09-10 | Disposition: A | Discharge status: Home / Self Care | Payer: BLUE CROSS/BLUE SHIELD | Source: Ambulatory Visit | Attending: Family Medicine | Admitting: Family Medicine

## 2018-09-10 ENCOUNTER — Ambulatory Visit (INDEPENDENT_AMBULATORY_CARE_PROVIDER_SITE_OTHER): Payer: BLUE CROSS/BLUE SHIELD | Admitting: Family Medicine

## 2018-09-10 ENCOUNTER — Encounter: Payer: Self-pay | Admitting: Family Medicine

## 2018-09-10 VITALS — BP 102/60 | HR 101 | Temp 98.3°F | Ht 67.75 in | Wt 160.0 lb

## 2018-09-10 DIAGNOSIS — Z8041 Family history of malignant neoplasm of ovary: Secondary | ICD-10-CM

## 2018-09-10 DIAGNOSIS — Z1239 Encounter for other screening for malignant neoplasm of breast: Secondary | ICD-10-CM

## 2018-09-10 DIAGNOSIS — Z124 Encounter for screening for malignant neoplasm of cervix: Secondary | ICD-10-CM

## 2018-09-10 DIAGNOSIS — E039 Hypothyroidism, unspecified: Secondary | ICD-10-CM | POA: Diagnosis not present

## 2018-09-10 DIAGNOSIS — Z803 Family history of malignant neoplasm of breast: Secondary | ICD-10-CM | POA: Diagnosis not present

## 2018-09-10 DIAGNOSIS — Z79890 Hormone replacement therapy (postmenopausal): Secondary | ICD-10-CM | POA: Diagnosis not present

## 2018-09-10 DIAGNOSIS — Z113 Encounter for screening for infections with a predominantly sexual mode of transmission: Secondary | ICD-10-CM | POA: Diagnosis not present

## 2018-09-10 DIAGNOSIS — Z01419 Encounter for gynecological examination (general) (routine) without abnormal findings: Secondary | ICD-10-CM | POA: Diagnosis not present

## 2018-09-10 LAB — COMPREHENSIVE METABOLIC PANEL WITH GFR
ALT: 12 U/L (ref 0–35)
AST: 17 U/L (ref 0–37)
Albumin: 4.6 g/dL (ref 3.5–5.2)
Alkaline Phosphatase: 67 U/L (ref 39–117)
BUN: 18 mg/dL (ref 6–23)
CO2: 29 meq/L (ref 19–32)
Calcium: 10 mg/dL (ref 8.4–10.5)
Chloride: 102 meq/L (ref 96–112)
Creatinine, Ser: 0.87 mg/dL (ref 0.40–1.20)
GFR: 68.83 mL/min (ref >60.00)
Glucose, Bld: 95 mg/dL (ref 70–99)
Potassium: 3.9 meq/L (ref 3.5–5.1)
Sodium: 139 meq/L (ref 135–145)
Total Bilirubin: 0.3 mg/dL (ref 0.2–1.2)
Total Protein: 7.7 g/dL (ref 6.0–8.3)

## 2018-09-10 LAB — CBC WITH DIFFERENTIAL/PLATELET
Basophils Absolute: 0 10*3/uL (ref 0.0–0.1)
Basophils Relative: 0.6 % (ref 0.0–3.0)
Eosinophils Absolute: 0.1 10*3/uL (ref 0.0–0.7)
Eosinophils Relative: 1.9 % (ref 0.0–5.0)
HCT: 43.3 % (ref 36.0–46.0)
Hemoglobin: 14.6 g/dL (ref 12.0–15.0)
Lymphocytes Relative: 27.8 % (ref 12.0–46.0)
Lymphs Abs: 1.7 10*3/uL (ref 0.7–4.0)
MCHC: 33.6 g/dL (ref 30.0–36.0)
MCV: 91.4 fl (ref 78.0–100.0)
Monocytes Absolute: 0.4 10*3/uL (ref 0.1–1.0)
Monocytes Relative: 7.1 % (ref 3.0–12.0)
Neutro Abs: 3.7 10*3/uL (ref 1.4–7.7)
Neutrophils Relative %: 62.6 % (ref 43.0–77.0)
Platelets: 341 10*3/uL (ref 150.0–400.0)
RBC: 4.74 Mil/uL (ref 3.87–5.11)
RDW: 12.8 % (ref 11.5–15.5)
WBC: 6 10*3/uL (ref 4.0–10.5)

## 2018-09-10 LAB — TSH: TSH: 2.09 u[IU]/mL (ref 0.35–4.50)

## 2018-09-10 LAB — T4, FREE: Free T4: 0.8 ng/dL (ref 0.60–1.60)

## 2018-09-10 LAB — VITAMIN D 25 HYDROXY (VIT D DEFICIENCY, FRACTURES): VITD: 42.29 ng/mL (ref 30.00–100.00)

## 2018-09-10 NOTE — Assessment & Plan Note (Signed)
Mammogram ordered- pt to schedule.

## 2018-09-10 NOTE — Assessment & Plan Note (Signed)
Reviewed preventive care protocols, scheduled due services, and updated immunizations Discussed nutrition, exercise, diet, and healthy lifestyle.  Pap smear done today. 

## 2018-09-10 NOTE — Assessment & Plan Note (Signed)
Ovarian/pelvic US ordered per pt request.

## 2018-09-10 NOTE — Progress Notes (Signed)
Subjective:   Patient ID: Debra Wheeler, female    DOB: 01-07-1966, 53 y.o.   MRN: [id number]  Debra Wheeler is a pleasant 53 y.o. year old female who presents to clinic today with Annual Exam (Patient is here today for a CPE with PAP.  She is not fasting.  She would like screening for all STD's including HIV lab draw.  She agrees to go to [place] for Mammogram if order is placed. Declines flu shot.  She is requesting a transvag U/S to check for any abnormalities with her ovaries.  Sister Dx with ovarian Ca. Mother also passed of Ca.)  on 09/10/2018  HPI:  Colonoscopy 11/27/16 Dr. Collene Mares, 5 year recall. Mammogram 10/03/17  Health Maintenance  Topic Date Due  . HIV Screening  05/13/1983  . TETANUS/TDAP  09/15/2026 (Originally 05/13/1987)  . PAP SMEAR-Modifier  08/31/2019  . MAMMOGRAM  10/03/2019  . COLONOSCOPY  11/28/2026  . INFLUENZA VACCINE  Discontinued    Strong FH of breast cancer- mother died of breast CA at 100 yo. Sister just had mastectomy due to breast cancer at 51 yo. She had BRCA testing- BRCA1 neg, BRCA2 uncertain.  No current outpatient medications on file prior to visit.   No current facility-administered medications on file prior to visit.     Allergies  Allergen Reactions  . Penicillins Rash    No past medical history on file.  No past surgical history on file.  Family History  Problem Relation Age of Onset  . Cancer Sister 15       breast- BRCA positive  . Breast cancer Sister 29  . Breast cancer Mother 44    Social History   Socioeconomic History  . Marital status: Married    Spouse name: Not on file  . Number of children: 2  . Years of education: Not on file  . Highest education level: Not on file  Occupational History  . Not on file  Social Needs  . Financial resource strain: Not on file  . Food insecurity:    Worry: Not on file    Inability: Not on file  . Transportation needs:    Medical: Not on file    Non-medical: Not on  file  Tobacco Use  . Smoking status: Never Smoker  . Smokeless tobacco: Never Used  Substance and Sexual Activity  . Alcohol use: Not on file  . Drug use: Not on file  . Sexual activity: Not on file  Lifestyle  . Physical activity:    Days per week: Not on file    Minutes per session: Not on file  . Stress: Not on file  Relationships  . Social connections:    Talks on phone: Not on file    Gets together: Not on file    Attends religious service: Not on file    Active member of club or organization: Not on file    Attends meetings of clubs or organizations: Not on file    Relationship status: Not on file  . Intimate partner violence:    Fear of current or ex partner: Not on file    Emotionally abused: Not on file    Physically abused: Not on file    Forced sexual activity: Not on file  Other Topics Concern  . Not on file  Social History Narrative  . Not on file   The PMH, PSH, Social History, Family History, Medications, and allergies have been reviewed in [facility], and have been  updated if relevant.   Review of Systems  Constitutional: Negative.   HENT: Negative.   Eyes: Negative.   Respiratory: Negative.   Cardiovascular: Negative.   Gastrointestinal: Negative.   Endocrine: Negative.   Genitourinary: Negative.   Musculoskeletal: Negative.   Allergic/Immunologic: Negative.   Neurological: Negative.   Hematological: Negative.   Psychiatric/Behavioral: Negative.   All other systems reviewed and are negative.      Objective:    BP 102/60 (BP Location: Left Arm, Patient Position: Sitting, Cuff Size: Normal)   Pulse (!) 101   Temp 98.3 F (36.8 C) (Oral)   Ht 5' 7.75" (1.721 m)   Wt 160 lb (72.6 kg)   LMP 11/07/2013   SpO2 97%   BMI 24.51 kg/m    Physical Exam    General:  Well-developed,well-nourished,in no acute distress; alert,appropriate and cooperative throughout examination Head:  normocephalic and atraumatic.   Eyes:  vision grossly intact,  PERRL Ears:  R ear normal and L ear normal externally, TMs clear bilaterally Nose:  no external deformity.   Mouth:  good dentition.   Neck:  No deformities, masses, or tenderness noted. Breasts:  No mass, nodules, thickening, tenderness, bulging, retraction, inflamation, nipple discharge or skin changes noted.   Lungs:  Normal respiratory effort, chest expands symmetrically. Lungs are clear to auscultation, no crackles or wheezes. Heart:  Normal rate and regular rhythm. S1 and S2 normal without gallop, murmur, click, rub or other extra sounds. Abdomen:  Bowel sounds positive,abdomen soft and non-tender without masses, organomegaly or hernias noted. Rectal:  no external abnormalities.   Genitalia:  Pelvic Exam:        External: normal female genitalia without lesions or masses        Vagina: normal without lesions or masses        Cervix: normal without lesions or masses        Adnexa: normal bimanual exam without masses or fullness        Uterus: normal by palpation        Pap smear: performed Msk:  No deformity or scoliosis noted of thoracic or lumbar spine.   Extremities:  No clubbing, cyanosis, edema, or deformity noted with normal full range of motion of all joints.   Neurologic:  alert & oriented X3 and gait normal.   Skin:  Intact without suspicious lesions or rashes Cervical Nodes:  No lymphadenopathy noted Axillary Nodes:  No palpable lymphadenopathy Psych:  Cognition and judgment appear intact. Alert and cooperative with normal attention span and concentration. No apparent delusions, illusions, hallucinations      Assessment & Plan:   Well woman exam with routine gynecological exam - Plan: HIV antibody (with reflex), Cytology - PAP( [place]), RPR  Screening for cervical cancer - Plan: Cytology - PAP( [place])  Screening for STD (sexually transmitted disease) - Plan: HIV antibody (with reflex), Cytology - PAP( [place]), [facility] No follow-ups on file.

## 2018-09-10 NOTE — Patient Instructions (Signed)
Great to see you. I will call you with your lab results from today and you can view them online.    Please [facility] to schedule mammogram and ovarian ultrasound.

## 2018-09-11 LAB — RPR: RPR Ser Ql: NONREACTIVE

## 2018-09-11 LAB — T3: T3, Total: 106 ng/dL (ref 76–181)

## 2018-09-11 LAB — HIV ANTIBODY (ROUTINE TESTING W REFLEX): HIV: NONREACTIVE

## 2018-09-13 LAB — CYTOLOGY - PAP
CHLAMYDIA, DNA PROBE: NEGATIVE
Diagnosis: NEGATIVE
HPV (WINDOPATH): NOT DETECTED
Neisseria Gonorrhea: NEGATIVE
Trichomonas: NEGATIVE

## 2018-09-15 ENCOUNTER — Encounter: Payer: Self-pay | Admitting: Family Medicine

## 2018-09-16 LAB — CERVICOVAGINAL ANCILLARY ONLY: Herpes: NEGATIVE

## 2018-09-19 ENCOUNTER — Ambulatory Visit
Admission: RE | Admit: 2018-09-19 | Discharge: 2018-09-19 | Disposition: A | Discharge status: Home / Self Care | Payer: Self-pay

## 2018-09-19 ENCOUNTER — Other Ambulatory Visit: Payer: Self-pay

## 2018-09-19 ENCOUNTER — Ambulatory Visit
Admission: RE | Admit: 2018-09-19 | Discharge: 2018-09-19 | Disposition: A | Discharge status: Home / Self Care | Payer: BLUE CROSS/BLUE SHIELD | Source: Ambulatory Visit | Attending: Family Medicine | Admitting: Family Medicine

## 2018-09-19 DIAGNOSIS — Z803 Family history of malignant neoplasm of breast: Secondary | ICD-10-CM

## 2018-09-19 DIAGNOSIS — Z8041 Family history of malignant neoplasm of ovary: Secondary | ICD-10-CM

## 2018-11-28 DIAGNOSIS — M5387 Other specified dorsopathies, lumbosacral region: Secondary | ICD-10-CM | POA: Diagnosis not present

## 2018-11-28 DIAGNOSIS — M5417 Radiculopathy, lumbosacral region: Secondary | ICD-10-CM | POA: Diagnosis not present

## 2018-11-28 DIAGNOSIS — M9903 Segmental and somatic dysfunction of lumbar region: Secondary | ICD-10-CM | POA: Diagnosis not present

## 2018-11-28 DIAGNOSIS — M545 Low back pain: Secondary | ICD-10-CM | POA: Diagnosis not present

## 2019-01-21 ENCOUNTER — Ambulatory Visit
Admission: RE | Admit: 2019-01-21 | Discharge: 2019-01-21 | Disposition: A | Discharge status: Home / Self Care | Payer: BLUE CROSS/BLUE SHIELD | Source: Ambulatory Visit | Attending: Family Medicine | Admitting: Family Medicine

## 2019-01-21 ENCOUNTER — Other Ambulatory Visit: Payer: Self-pay

## 2019-01-21 DIAGNOSIS — Z1231 Encounter for screening mammogram for malignant neoplasm of breast: Secondary | ICD-10-CM | POA: Insufficient documentation

## 2019-01-21 DIAGNOSIS — Z1239 Encounter for other screening for malignant neoplasm of breast: Secondary | ICD-10-CM

## 2019-03-27 DIAGNOSIS — R5383 Other fatigue: Secondary | ICD-10-CM | POA: Diagnosis not present

## 2019-03-27 DIAGNOSIS — E7211 Homocystinuria: Secondary | ICD-10-CM | POA: Diagnosis not present

## 2019-03-27 DIAGNOSIS — E039 Hypothyroidism, unspecified: Secondary | ICD-10-CM | POA: Diagnosis not present

## 2019-03-27 DIAGNOSIS — F419 Anxiety disorder, unspecified: Secondary | ICD-10-CM | POA: Diagnosis not present

## 2019-03-27 DIAGNOSIS — E559 Vitamin D deficiency, unspecified: Secondary | ICD-10-CM | POA: Diagnosis not present

## 2019-04-29 DIAGNOSIS — M9903 Segmental and somatic dysfunction of lumbar region: Secondary | ICD-10-CM | POA: Diagnosis not present

## 2019-04-29 DIAGNOSIS — M5417 Radiculopathy, lumbosacral region: Secondary | ICD-10-CM | POA: Diagnosis not present

## 2019-04-29 DIAGNOSIS — M545 Low back pain: Secondary | ICD-10-CM | POA: Diagnosis not present

## 2019-04-29 DIAGNOSIS — M5387 Other specified dorsopathies, lumbosacral region: Secondary | ICD-10-CM | POA: Diagnosis not present

## 2019-05-02 DIAGNOSIS — M545 Low back pain: Secondary | ICD-10-CM | POA: Diagnosis not present

## 2019-05-02 DIAGNOSIS — M9903 Segmental and somatic dysfunction of lumbar region: Secondary | ICD-10-CM | POA: Diagnosis not present

## 2019-05-02 DIAGNOSIS — M5387 Other specified dorsopathies, lumbosacral region: Secondary | ICD-10-CM | POA: Diagnosis not present

## 2019-05-02 DIAGNOSIS — M5417 Radiculopathy, lumbosacral region: Secondary | ICD-10-CM | POA: Diagnosis not present

## 2019-05-09 DIAGNOSIS — M5387 Other specified dorsopathies, lumbosacral region: Secondary | ICD-10-CM | POA: Diagnosis not present

## 2019-05-09 DIAGNOSIS — M5417 Radiculopathy, lumbosacral region: Secondary | ICD-10-CM | POA: Diagnosis not present

## 2019-05-09 DIAGNOSIS — M545 Low back pain: Secondary | ICD-10-CM | POA: Diagnosis not present

## 2019-05-09 DIAGNOSIS — M9903 Segmental and somatic dysfunction of lumbar region: Secondary | ICD-10-CM | POA: Diagnosis not present

## 2019-05-16 DIAGNOSIS — M545 Low back pain: Secondary | ICD-10-CM | POA: Diagnosis not present

## 2019-05-16 DIAGNOSIS — M9903 Segmental and somatic dysfunction of lumbar region: Secondary | ICD-10-CM | POA: Diagnosis not present

## 2019-05-16 DIAGNOSIS — M5417 Radiculopathy, lumbosacral region: Secondary | ICD-10-CM | POA: Diagnosis not present

## 2019-05-16 DIAGNOSIS — M5387 Other specified dorsopathies, lumbosacral region: Secondary | ICD-10-CM | POA: Diagnosis not present

## 2019-07-25 DIAGNOSIS — Z20822 Contact with and (suspected) exposure to covid-19: Secondary | ICD-10-CM | POA: Diagnosis not present

## 2019-08-25 ENCOUNTER — Other Ambulatory Visit: Payer: Self-pay

## 2019-08-25 ENCOUNTER — Ambulatory Visit: Payer: BLUE CROSS/BLUE SHIELD | Admitting: Internal Medicine

## 2019-08-25 ENCOUNTER — Encounter: Payer: Self-pay | Admitting: Internal Medicine

## 2019-08-25 VITALS — BP 106/68 | HR 79 | Temp 97.1°F | Wt 164.0 lb

## 2019-08-25 DIAGNOSIS — A6004 Herpesviral vulvovaginitis: Secondary | ICD-10-CM

## 2019-08-25 DIAGNOSIS — M25561 Pain in right knee: Secondary | ICD-10-CM

## 2019-08-25 DIAGNOSIS — G8929 Other chronic pain: Secondary | ICD-10-CM

## 2019-08-25 DIAGNOSIS — M25562 Pain in left knee: Secondary | ICD-10-CM

## 2019-08-25 DIAGNOSIS — Z9889 Other specified postprocedural states: Secondary | ICD-10-CM | POA: Diagnosis not present

## 2019-08-25 DIAGNOSIS — N951 Menopausal and female climacteric states: Secondary | ICD-10-CM | POA: Diagnosis not present

## 2019-08-25 MED ORDER — ACYCLOVIR 400 MG PO TABS: 400.0000 mg | ORAL_TABLET | Freq: Three times a day (TID) | ORAL | 0 refills | Status: AC

## 2019-08-25 NOTE — Progress Notes (Signed)
HPI  Pt presents to the clinic today to establish care and for management of the conditions listed below. She is transferring care from Dr. Deborra Medina.  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 She has takes Zovirax in past and would like a refill of this today.  Hot Flashes: She is taking Progesterone rescribed by Dr. Abner Greenspan in [place], [place] ([facility]).  She is also requesting referral to orthopedist. She has had bilateral ACL repairs in the past, and is concerned about bilateral knee pain and weakness.  Flu: never Tetanus: unsure Pap Smear: 09/2018 Mammogram: 01/2019 Colon Screening: 11/2016 Vision Screening: annually Dentist: biannually   No past medical history on file.  No current outpatient medications on file.   No current facility-administered medications for this visit.    Allergies  Allergen Reactions  . Penicillins Rash    Family History  Problem Relation Age of Onset  . Cancer Sister 40       breast- BRCA positive  . Breast cancer Sister 33  . Breast cancer Mother 60    Social History   Socioeconomic History  . Marital status: Married    Spouse name: Not on file  . Number of children: 2  . Years of education: Not on file  . Highest education level: Not on file  Occupational History  . Not on file  Tobacco Use  . Smoking status: Never Smoker  . Smokeless tobacco: Never Used  Substance and Sexual Activity  . Alcohol use: Not on file  . Drug use: Not on file  . Sexual activity: Not on file  Other Topics Concern  . Not on file  Social History Narrative  . Not on file   Social Determinants of Health   Financial Resource Strain:   . Difficulty of Paying Living Expenses: Not on file  Food Insecurity:   . Worried About Charity fundraiser in the Last Year: Not on file  . Ran Out of Food in the Last Year: Not on file  Transportation Needs:   . Lack of Transportation (Medical): Not on file  . Lack of Transportation  (Non-Medical): Not on file  Physical Activity:   . Days of Exercise per Week: Not on file  . Minutes of Exercise per Session: Not on file  Stress:   . Feeling of Stress : Not on file  Social Connections:   . Frequency of Communication with Friends and Family: Not on file  . Frequency of Social Gatherings with Friends and Family: Not on file  . Attends Religious Services: Not on file  . Active Member of Clubs or Organizations: Not on file  . Attends Archivist Meetings: Not on file  . Marital Status: Not on file  Intimate Partner Violence:   . Fear of Current or Ex-Partner: Not on file  . Emotionally Abused: Not on file  . Physically Abused: Not on file  . Sexually Abused: Not on file    ROS:  Constitutional: Denies fever, malaise, fatigue, headache or abrupt weight changes.  HEENT: Denies eye pain, eye redness, ear pain, ringing in the ears, wax buildup, runny nose, nasal congestion, bloody nose, or sore throat. Respiratory: Denies difficulty breathing, shortness of breath, cough or sputum production.   Cardiovascular: Denies chest pain, chest tightness, palpitations or swelling in the hands or feet.  Gastrointestinal: Denies abdominal pain, bloating, constipation, diarrhea or blood in the stool.  GU: Denies frequency, urgency, pain with urination, blood in urine, odor  or discharge. Musculoskeletal: Pt reports bilateral knee pain and weakness. Denies decrease in range of motion, difficulty with gait, muscle pain or joint swelling.  Skin: Denies redness, rashes, lesions or ulcercations.  Neurological: Denies dizziness, difficulty with memory, difficulty with speech or problems with balance and coordination.  Psych: Denies anxiety, depression, SI/HI.  No other specific complaints in a complete review of systems (except as listed in HPI above).  PE:  BP 106/68   Pulse 79   Temp (!) 97.1 F (36.2 C) (Temporal)   Wt 164 lb (74.4 kg)   LMP 11/07/2013   SpO2 98%   BMI  25.12 kg/m   Wt Readings from Last 3 Encounters:  09/10/18 160 lb (72.6 kg)  09/05/18 161 lb (73 kg)  09/14/16 158 lb (71.7 kg)    General: Appears her stated age, well developed, well nourished in NAD. HEENT: Head: normal shape and size; Eyes: sclera white, no icterus, conjunctiva pink, PERRLA and EOMs intact;  Cardiovascular: Normal rate and rhythm.  Pulmonary/Chest: Normal effort and positive vesicular breath sounds.  Musculoskeletal:  No difficulty with gait.  Neurological: Alert and oriented. Psychiatric: Mood and affect normal. Behavior is normal. Judgment and thought content normal.     BMET    Component Value Date/Time   NA 139 09/10/2018 1236   K 3.9 09/10/2018 1236   CL 102 09/10/2018 1236   CO2 29 09/10/2018 1236   GLUCOSE 95 09/10/2018 1236   BUN 18 09/10/2018 1236   CREATININE 0.87 09/10/2018 1236   CALCIUM 10.0 09/10/2018 1236    Lipid Panel     Component Value Date/Time   CHOL 192 09/14/2016 0858   TRIG 53.0 09/14/2016 0858   HDL 59.10 09/14/2016 0858   CHOLHDL 3 09/14/2016 0858   VLDL 10.6 09/14/2016 0858   LDLCALC 122 (H) 09/14/2016 0858    CBC    Component Value Date/Time   WBC 6.0 09/10/2018 1236   RBC 4.74 09/10/2018 1236   HGB 14.6 09/10/2018 1236   HCT 43.3 09/10/2018 1236   PLT 341.0 09/10/2018 1236   MCV 91.4 09/10/2018 1236   MCHC 33.6 09/10/2018 1236   RDW 12.8 09/10/2018 1236   LYMPHSABS 1.7 09/10/2018 1236   MONOABS 0.4 09/10/2018 1236   EOSABS 0.1 09/10/2018 1236   BASOSABS 0.0 09/10/2018 1236    Hgb A1C Lab Results  Component Value Date   HGBA1C 5.7 06/01/2014     Assessment and Plan:  Bilateral Knee Pain, Hx of Bilateral ACL Repair:  Referral to ortho for evaluation per pt request  Schedule an appt for your annual exam   Webb Silversmith, NP This visit occurred during the SARS-CoV-2 public health emergency.  Safety protocols were in place, including screening questions prior to the visit, additional usage of staff  PPE, and extensive cleaning of exam room while observing appropriate contact time as indicated for disinfecting solutions.

## 2019-08-25 NOTE — Assessment & Plan Note (Signed)
Continue Progesterone prescribed bu Dr. Raquel James

## 2019-08-25 NOTE — Assessment & Plan Note (Signed)
RX for Zovirax 400 mg TID prn for outbreaks

## 2019-08-25 NOTE — Patient Instructions (Signed)
Journal for Nurse Practitioners, 15(4), 263-267. Retrieved April 15, 2018 from http://clinicalkey.com/nursing">  Knee Exercises Ask your health care provider which exercises are safe for you. Do exercises exactly as told by your health care provider and adjust them as directed. It is normal to feel mild stretching, pulling, tightness, or discomfort as you do these exercises. Stop right away if you feel sudden pain or your pain gets worse. Do not begin these exercises until told by your health care provider. Stretching and range-of-motion exercises These exercises warm up your muscles and joints and improve the movement and flexibility of your knee. These exercises also help to relieve pain and swelling. Knee extension, prone 1. Lie on your abdomen (prone position) on a bed. 2. Place your left / right knee just beyond the edge of the surface so your knee is not on the bed. You can put a towel under your left / right thigh just above your kneecap for comfort. 3. Relax your leg muscles and allow gravity to straighten your knee (extension). You should feel a stretch behind your left / right knee. 4. Hold this position for __________ seconds. 5. Scoot up so your knee is supported between repetitions. Repeat __________ times. Complete this exercise __________ times a day. Knee flexion, active  1. Lie on your back with both legs straight. If this causes back discomfort, bend your left / right knee so your foot is flat on the floor. 2. Slowly slide your left / right heel back toward your buttocks. Stop when you feel a gentle stretch in the front of your knee or thigh (flexion). 3. Hold this position for __________ seconds. 4. Slowly slide your left / right heel back to the starting position. Repeat __________ times. Complete this exercise __________ times a day. Quadriceps stretch, prone  1. Lie on your abdomen on a firm surface, such as a bed or padded floor. 2. Bend your left / right knee and hold  your ankle. If you cannot reach your ankle or pant leg, loop a belt around your foot and grab the belt instead. 3. Gently pull your heel toward your buttocks. Your knee should not slide out to the side. You should feel a stretch in the front of your thigh and knee (quadriceps). 4. Hold this position for __________ seconds. Repeat __________ times. Complete this exercise __________ times a day. Hamstring, supine 1. Lie on your back (supine position). 2. Loop a belt or towel over the ball of your left / right foot. The ball of your foot is on the walking surface, right under your toes. 3. Straighten your left / right knee and slowly pull on the belt to raise your leg until you feel a gentle stretch behind your knee (hamstring). ? Do not let your knee bend while you do this. ? Keep your other leg flat on the floor. 4. Hold this position for __________ seconds. Repeat __________ times. Complete this exercise __________ times a day. Strengthening exercises These exercises build strength and endurance in your knee. Endurance is the ability to use your muscles for a long time, even after they get tired. Quadriceps, isometric This exercise stretches the muscles in front of your thigh (quadriceps) without moving your knee joint (isometric). 1. Lie on your back with your left / right leg extended and your other knee bent. Put a rolled towel or small pillow under your knee if told by your health care provider. 2. Slowly tense the muscles in the front of your left /   right thigh. You should see your kneecap slide up toward your hip or see increased dimpling just above the knee. This motion will push the back of the knee toward the floor. 3. For __________ seconds, hold the muscle as tight as you can without increasing your pain. 4. Relax the muscles slowly and completely. Repeat __________ times. Complete this exercise __________ times a day. Straight leg raises This exercise stretches the muscles in front  of your thigh (quadriceps) and the muscles that move your hips (hip flexors). 1. Lie on your back with your left / right leg extended and your other knee bent. 2. Tense the muscles in the front of your left / right thigh. You should see your kneecap slide up or see increased dimpling just above the knee. Your thigh may even shake a bit. 3. Keep these muscles tight as you raise your leg 4-6 inches (10-15 cm) off the floor. Do not let your knee bend. 4. Hold this position for __________ seconds. 5. Keep these muscles tense as you lower your leg. 6. Relax your muscles slowly and completely after each repetition. Repeat __________ times. Complete this exercise __________ times a day. Hamstring, isometric 1. Lie on your back on a firm surface. 2. Bend your left / right knee about __________ degrees. 3. Dig your left / right heel into the surface as if you are trying to pull it toward your buttocks. Tighten the muscles in the back of your thighs (hamstring) to "dig" as hard as you can without increasing any pain. 4. Hold this position for __________ seconds. 5. Release the tension gradually and allow your muscles to relax completely for __________ seconds after each repetition. Repeat __________ times. Complete this exercise __________ times a day. Hamstring curls If told by your health care provider, do this exercise while wearing ankle weights. Begin with __________ lb weights. Then increase the weight by 1 lb (0.5 kg) increments. Do not wear ankle weights that are more than __________ lb. 1. Lie on your abdomen with your legs straight. 2. Bend your left / right knee as far as you can without feeling pain. Keep your hips flat against the floor. 3. Hold this position for __________ seconds. 4. Slowly lower your leg to the starting position. Repeat __________ times. Complete this exercise __________ times a day. Squats This exercise strengthens the muscles in front of your thigh and knee  (quadriceps). 1. Stand in front of a table, with your feet and knees pointing straight ahead. You may rest your hands on the table for balance but not for support. 2. Slowly bend your knees and lower your hips like you are going to sit in a chair. ? Keep your weight over your heels, not over your toes. ? Keep your lower legs upright so they are parallel with the table legs. ? Do not let your hips go lower than your knees. ? Do not bend lower than told by your health care provider. ? If your knee pain increases, do not bend as low. 3. Hold the squat position for __________ seconds. 4. Slowly push with your legs to return to standing. Do not use your hands to pull yourself to standing. Repeat __________ times. Complete this exercise __________ times a day. Wall slides This exercise strengthens the muscles in front of your thigh and knee (quadriceps). 1. Lean your back against a smooth wall or door, and walk your feet out 18-24 inches (46-61 cm) from it. 2. Place your feet hip-width apart. 3.   Slowly slide down the wall or door until your knees bend __________ degrees. Keep your knees over your heels, not over your toes. Keep your knees in line with your hips. 4. Hold this position for __________ seconds. Repeat __________ times. Complete this exercise __________ times a day. Straight leg raises This exercise strengthens the muscles that rotate the leg at the hip and move it away from your body (hip abductors). 1. Lie on your side with your left / right leg in the top position. Lie so your head, shoulder, knee, and hip line up. You may bend your bottom knee to help you keep your balance. 2. Roll your hips slightly forward so your hips are stacked directly over each other and your left / right knee is facing forward. 3. Leading with your heel, lift your top leg 4-6 inches (10-15 cm). You should feel the muscles in your outer hip lifting. ? Do not let your foot drift forward. ? Do not let your knee  roll toward the ceiling. 4. Hold this position for __________ seconds. 5. Slowly return your leg to the starting position. 6. Let your muscles relax completely after each repetition. Repeat __________ times. Complete this exercise __________ times a day. Straight leg raises This exercise stretches the muscles that move your hips away from the front of the pelvis (hip extensors). 1. Lie on your abdomen on a firm surface. You can put a pillow under your hips if that is more comfortable. 2. Tense the muscles in your buttocks and lift your left / right leg about 4-6 inches (10-15 cm). Keep your knee straight as you lift your leg. 3. Hold this position for __________ seconds. 4. Slowly lower your leg to the starting position. 5. Let your leg relax completely after each repetition. Repeat __________ times. Complete this exercise __________ times a day. This information is not intended to replace advice given to you by your health care provider. Make sure you discuss any questions you have with your health care provider. Document Revised: 04/16/2018 Document Reviewed: 04/16/2018 Elsevier Patient Education  2020 [employer]  

## 2019-09-10 ENCOUNTER — Other Ambulatory Visit: Payer: Self-pay

## 2019-09-10 ENCOUNTER — Ambulatory Visit: Payer: Self-pay

## 2019-09-10 ENCOUNTER — Ambulatory Visit (INDEPENDENT_AMBULATORY_CARE_PROVIDER_SITE_OTHER): Payer: BLUE CROSS/BLUE SHIELD | Admitting: Orthopedic Surgery

## 2019-09-10 VITALS — Ht 68.0 in | Wt 155.0 lb

## 2019-09-10 DIAGNOSIS — Z9889 Other specified postprocedural states: Secondary | ICD-10-CM | POA: Diagnosis not present

## 2019-09-10 DIAGNOSIS — M25562 Pain in left knee: Secondary | ICD-10-CM | POA: Diagnosis not present

## 2019-09-10 DIAGNOSIS — M25561 Pain in right knee: Secondary | ICD-10-CM

## 2019-09-10 DIAGNOSIS — M2351 Chronic instability of knee, right knee: Secondary | ICD-10-CM

## 2019-09-10 DIAGNOSIS — M170 Bilateral primary osteoarthritis of knee: Secondary | ICD-10-CM | POA: Diagnosis not present

## 2019-09-10 MED ORDER — MELOXICAM 15 MG PO TABS: 15.0000 mg | ORAL_TABLET | Freq: Every day | ORAL | 0 refills | Status: AC

## 2019-09-13 ENCOUNTER — Encounter: Payer: Self-pay | Admitting: Orthopedic Surgery

## 2019-09-13 NOTE — Progress Notes (Signed)
Office Visit Note   Patient: Debra Wheeler           Date of Birth: 1965-09-26           MRN: [id number] Visit Date: 09/10/2019 Requested by: Jearld Fenton, NP [place],  [place] [address] PCP: Jearld Fenton, NP  Subjective: Chief Complaint  Patient presents with  . Right Knee - Pain  . Left Knee - Pain    HPI: Debra Wheeler is a 54 y.o. female who presents to the office complaining of bilateral knee pain.  Patient has a long history of bilateral knee pain.  She has had many years of knee pain but cannot point to a specific injury or instance where this began.  She does have a history of bilateral knee ACL reconstruction at age 47.  She localizes her pain to the medial aspect of both knees.  She denies any mechanical symptoms but does note that both knees give way, and the right knee gives way significantly more than the left.  She has not tried any medication for her pain but has used ice which is helped.  She denies any groin pain but does note some occasional low back pain without radiculopathy or numbness/tingling.  She likes to workout 2 times a week with a personal trainer and would like to continue to do this without fear of her knee giving way.  She denies any history of smoking, hypertension, diabetes.  She has not had any recent MRI scan on either knee..                ROS:  All systems reviewed are negative as they relate to the chief complaint within the history of present illness.  Patient denies fevers or chills.  Assessment & Plan: Visit Diagnoses:  1. Bilateral primary osteoarthritis of knee   2. Pain in both knees, unspecified chronicity   3. History of reconstruction of anterior cruciate ligament tear   4. Recurrent right knee instability     Plan: Patient is a 54 year old female who presents complaining of bilateral knee pain.  She has had years of bilateral knee pain as well as knee instability.  Her symptoms are worse in the right  knee.  She has history of bilateral ACL reconstruction when she was 20.  On exam she does have more tenderness over the medial joint line bilaterally.  She also has significantly increased ACL laxity of the right knee compared with the contralateral side.  Radiographs taken today reveal moderate degenerative changes throughout bilateral knees, worse in the medial aspect of the right knee.  Patient notes that instability is more of a concern than pain and wants to return to an active lifestyle without fear of her knee giving way.  Prescribed Mobic to help with patient's pain.  Ordered MRI of the right knee to evaluate for potential ACL attenuation in the setting of previous reconstruction.  She will follow-up after MRI to review results.  Again instability is more of a concern for this patient then pain.  Follow-Up Instructions: No follow-ups on file.   Orders:  Orders Placed This Encounter  Procedures  . XR Knee 1-2 Views Right  . XR Knee 1-2 Views Left  . MR Knee Right w/o contrast   Meds ordered this encounter  Medications  . meloxicam (MOBIC) 15 MG tablet    Sig: Take 1 tablet (15 mg total) by mouth daily.    Dispense:  30  tablet    Refill:  0      Procedures: No procedures performed   Clinical Data: No additional findings.  Objective: Vital Signs: Ht 5' 8"  (1.727 m)   Wt 155 lb (70.3 kg)   LMP 11/07/2013   BMI 23.57 kg/m   Physical Exam:  Constitutional: Patient appears well-developed HEENT:  Head: Normocephalic Eyes:EOM are normal Neck: Normal range of motion Cardiovascular: Normal rate Pulmonary/chest: Effort normal Neurologic: Patient is alert Skin: Skin is warm Psychiatric: Patient has normal mood and affect  Ortho Exam:  Bilateral knee Exam Tender to palpation over the medial and lateral joint lines of the both knees.  Most tender to palpation over the medial joint line of the right knee.  Significant patellofemoral grinding with passive flexion/extension of  both knees.  Increased laxity of the right knee with Lachman exam compared with the left knee.   No effusion Extensor mechanism intact Stable to varus/valgus stresses.   Extension to 0 degrees Flexion > 90 degrees  Specialty Comments:  No specialty comments available.  Imaging: No results found.   PMFS History: Patient Active Problem List   Diagnosis Date Noted  . Hot flashes due to menopause 08/25/2019  . Genital herpes 11/14/2013   No past medical history on file.  Family History  Problem Relation Age of Onset  . Cancer Sister 17       breast- BRCA positive  . Breast cancer Sister 51  . Breast cancer Mother 16    No past surgical history on file. Social History   Occupational History  . Not on file  Tobacco Use  . Smoking status: Never Smoker  . Smokeless tobacco: Never Used  Substance and Sexual Activity  . Alcohol use: Not on file  . Drug use: Not on file  . Sexual activity: Not on file

## 2019-09-15 ENCOUNTER — Encounter: Payer: Self-pay | Admitting: Orthopedic Surgery

## 2019-09-17 ENCOUNTER — Ambulatory Visit: Payer: Self-pay

## 2019-09-17 ENCOUNTER — Encounter: Payer: Self-pay | Admitting: Orthopedic Surgery

## 2019-09-17 ENCOUNTER — Ambulatory Visit (INDEPENDENT_AMBULATORY_CARE_PROVIDER_SITE_OTHER): Payer: BLUE CROSS/BLUE SHIELD

## 2019-09-17 ENCOUNTER — Ambulatory Visit (INDEPENDENT_AMBULATORY_CARE_PROVIDER_SITE_OTHER): Payer: BLUE CROSS/BLUE SHIELD | Admitting: Orthopedic Surgery

## 2019-09-17 ENCOUNTER — Other Ambulatory Visit: Payer: Self-pay

## 2019-09-17 VITALS — Ht 68.0 in | Wt 150.0 lb

## 2019-09-17 DIAGNOSIS — M25511 Pain in right shoulder: Secondary | ICD-10-CM

## 2019-09-17 DIAGNOSIS — M19011 Primary osteoarthritis, right shoulder: Secondary | ICD-10-CM

## 2019-09-17 DIAGNOSIS — G8929 Other chronic pain: Secondary | ICD-10-CM

## 2019-09-17 DIAGNOSIS — M19012 Primary osteoarthritis, left shoulder: Secondary | ICD-10-CM | POA: Diagnosis not present

## 2019-09-17 DIAGNOSIS — M25512 Pain in left shoulder: Secondary | ICD-10-CM

## 2019-09-19 ENCOUNTER — Encounter: Payer: Self-pay | Admitting: Orthopedic Surgery

## 2019-09-19 DIAGNOSIS — M19012 Primary osteoarthritis, left shoulder: Secondary | ICD-10-CM

## 2019-09-19 DIAGNOSIS — M19011 Primary osteoarthritis, right shoulder: Secondary | ICD-10-CM | POA: Diagnosis not present

## 2019-09-19 MED ORDER — LIDOCAINE HCL 1 % IJ SOLN
5.0000 mL | INTRAMUSCULAR | Status: AC | PRN
  Administered 2019-09-19: 17:00:00 5 mL

## 2019-09-19 MED ORDER — LIDOCAINE HCL 1 % IJ SOLN
5.0000 mL | INTRAMUSCULAR | Status: AC | PRN
  Administered 2019-09-19: 5 mL

## 2019-09-19 MED ORDER — TRIAMCINOLONE ACETONIDE 40 MG/ML IJ SUSP
40.0000 mg | INTRAMUSCULAR | Status: AC | PRN
  Administered 2019-09-19: 40 mg via INTRA_ARTICULAR

## 2019-09-19 MED ORDER — BUPIVACAINE HCL 0.5 % IJ SOLN
9.0000 mL | INTRAMUSCULAR | Status: AC | PRN
  Administered 2019-09-19: 17:00:00 9 mL via INTRA_ARTICULAR

## 2019-09-19 MED ORDER — BUPIVACAINE HCL 0.5 % IJ SOLN
9.0000 mL | INTRAMUSCULAR | Status: AC | PRN
  Administered 2019-09-19: 9 mL via INTRA_ARTICULAR

## 2019-09-19 NOTE — Progress Notes (Signed)
Office Visit Note   Patient: Debra Wheeler           Date of Birth: 1966-05-02           MRN: [id number] Visit Date: 09/17/2019 Requested by: Jearld Fenton, NP [place],  [place] [address] PCP: Jearld Fenton, NP  Subjective: Chief Complaint  Patient presents with  . Right Shoulder - Pain  . Left Shoulder - Pain    HPI: Debra Wheeler is a 54 y.o. female who presents to the office complaining of bilateral shoulder pain, right greater than left.  Patient has a long history of bilateral shoulder pain.  She has a history of 7 dislocations and 2 surgeries on the left shoulder with no surgical procedures on the right shoulder.  She notes that stiffness is more of a concern for her then pain is.  She has never had a shoulder injection before.  She localizes the pain diffusely throughout the bilateral shoulders with occasional radiation to the elbow.  She denies any numbness/tingling or weakness or significant neck pain.  Heat is helpful.  She denies any history of diabetes or thyroid issues..                ROS:  All systems reviewed are negative as they relate to the chief complaint within the history of present illness.  Patient denies fevers or chills.  Assessment & Plan: Visit Diagnoses:  1. Chronic pain of both shoulders   2. Primary osteoarthritis of shoulders, bilateral     Plan: Patient is a 54 year old female who presents complaint of bilateral shoulder pain.  Pain is been ongoing for several years.  Her main concern is lack of range of motion.  On exam she has excellent strength of the rotator cuff muscles.  Radiographs taken today reveal bilateral shoulder osteoarthritis, worse in the right shoulder.  However her range of motion is relatively preserved compared with the amount of arthritis that she has in her shoulders.  She works out often and I think this is done her well with preserving her shoulder range of motion.  Discussed natural history of  shoulder arthritis today.  Plan for bilateral shoulder injections today for temporary symptomatic pain relief..  Patient tolerated the procedure well.  She will follow-up with the office as needed regarding her shoulders.  She does have a knee MRI that is scheduled and she will follow-up for that soon.  Follow-Up Instructions: No follow-ups on file.   Orders:  Orders Placed This Encounter  Procedures  . XR Shoulder Left  . XR Shoulder Right   No orders of the defined types were placed in this encounter.     Procedures: Large Joint Inj: bilateral glenohumeral on 09/19/2019 4:35 PM Indications: diagnostic evaluation and pain Details: 18 G 1.5 in needle, posterior approach  Arthrogram: No  Medications (Right): 5 mL lidocaine 1 %; 9 mL bupivacaine 0.5 %; 40 mg triamcinolone acetonide 40 MG/ML Medications (Left): 5 mL lidocaine 1 %; 9 mL bupivacaine 0.5 %; 40 mg triamcinolone acetonide 40 MG/ML Outcome: tolerated well, no immediate complications Procedure, treatment alternatives, risks and benefits explained, specific risks discussed. Consent was given by the patient. Immediately prior to procedure a time out was called to verify the correct patient, procedure, equipment, support staff and site/side marked as required. Patient was prepped and draped in the usual sterile fashion.       Clinical Data: No additional findings.  Objective: Vital Signs: Ht  5' 8"  (1.727 m)   Wt 150 lb (68 kg)   LMP 11/07/2013   BMI 22.81 kg/m   Physical Exam:  Constitutional: Patient appears well-developed HEENT:  Head: Normocephalic Eyes:EOM are normal Neck: Normal range of motion Cardiovascular: Normal rate Pulmonary/chest: Effort normal Neurologic: Patient is alert Skin: Skin is warm Psychiatric: Patient has normal mood and affect  Ortho Exam:  Bilateral shoulder Exam Able to forward flex and abduct shoulder overhead bilaterally Preserved external rotation with no significant loss of  motion No TTP over the [facility] joint or bicipital groove Good subscapularis, supraspinatus, and infraspinatus strength Negative Hawkins impingement 5/5 grip strength, forearm pronation/supination, and bicep strength  Specialty Comments:  No specialty comments available.  Imaging: No results found.   PMFS History: Patient Active Problem List   Diagnosis Date Noted  . Hot flashes due to menopause 08/25/2019  . Genital herpes 11/14/2013   No past medical history on file.  Family History  Problem Relation Age of Onset  . Cancer Sister 89       breast- BRCA positive  . Breast cancer Sister 35  . Breast cancer Mother 91    No past surgical history on file. Social History   Occupational History  . Not on file  Tobacco Use  . Smoking status: Never Smoker  . Smokeless tobacco: Never Used  Substance and Sexual Activity  . Alcohol use: Not on file  . Drug use: Not on file  . Sexual activity: Not on file

## 2019-10-09 ENCOUNTER — Encounter: Payer: BLUE CROSS/BLUE SHIELD | Admitting: Internal Medicine

## 2019-10-15 DIAGNOSIS — Z20822 Contact with and (suspected) exposure to covid-19: Secondary | ICD-10-CM | POA: Diagnosis not present

## 2020-02-23 DIAGNOSIS — Z1152 Encounter for screening for COVID-19: Secondary | ICD-10-CM | POA: Diagnosis not present

## 2020-03-08 DIAGNOSIS — Z20822 Contact with and (suspected) exposure to covid-19: Secondary | ICD-10-CM | POA: Diagnosis not present

## 2020-03-09 DIAGNOSIS — N951 Menopausal and female climacteric states: Secondary | ICD-10-CM | POA: Diagnosis not present

## 2020-03-09 DIAGNOSIS — Z1211 Encounter for screening for malignant neoplasm of colon: Secondary | ICD-10-CM | POA: Diagnosis not present

## 2020-03-09 DIAGNOSIS — Z0000 Encounter for general adult medical examination without abnormal findings: Secondary | ICD-10-CM | POA: Diagnosis not present

## 2020-03-16 DIAGNOSIS — M810 Age-related osteoporosis without current pathological fracture: Secondary | ICD-10-CM | POA: Diagnosis not present

## 2020-03-16 DIAGNOSIS — R413 Other amnesia: Secondary | ICD-10-CM | POA: Diagnosis not present

## 2020-03-16 DIAGNOSIS — E559 Vitamin D deficiency, unspecified: Secondary | ICD-10-CM | POA: Diagnosis not present

## 2020-03-16 DIAGNOSIS — R5383 Other fatigue: Secondary | ICD-10-CM | POA: Diagnosis not present

## 2020-08-03 DIAGNOSIS — F411 Generalized anxiety disorder: Secondary | ICD-10-CM | POA: Diagnosis not present

## 2020-08-24 ENCOUNTER — Other Ambulatory Visit: Payer: Self-pay | Admitting: Internal Medicine

## 2020-08-24 ENCOUNTER — Other Ambulatory Visit: Payer: Self-pay | Admitting: Family Medicine

## 2020-08-24 DIAGNOSIS — Z1231 Encounter for screening mammogram for malignant neoplasm of breast: Secondary | ICD-10-CM

## 2020-09-06 DIAGNOSIS — R5383 Other fatigue: Secondary | ICD-10-CM | POA: Diagnosis not present

## 2020-09-06 DIAGNOSIS — Z780 Asymptomatic menopausal state: Secondary | ICD-10-CM | POA: Diagnosis not present

## 2020-09-06 DIAGNOSIS — E559 Vitamin D deficiency, unspecified: Secondary | ICD-10-CM | POA: Diagnosis not present

## 2020-09-06 DIAGNOSIS — E7211 Homocystinuria: Secondary | ICD-10-CM | POA: Diagnosis not present

## 2020-09-06 DIAGNOSIS — E618 Deficiency of other specified nutrient elements: Secondary | ICD-10-CM | POA: Diagnosis not present

## 2020-09-07 DIAGNOSIS — F411 Generalized anxiety disorder: Secondary | ICD-10-CM | POA: Diagnosis not present

## 2020-09-07 DIAGNOSIS — R278 Other lack of coordination: Secondary | ICD-10-CM | POA: Diagnosis not present

## 2020-09-07 DIAGNOSIS — Z79899 Other long term (current) drug therapy: Secondary | ICD-10-CM | POA: Diagnosis not present

## 2020-09-07 DIAGNOSIS — F902 Attention-deficit hyperactivity disorder, combined type: Secondary | ICD-10-CM | POA: Diagnosis not present

## 2020-09-14 DIAGNOSIS — F411 Generalized anxiety disorder: Secondary | ICD-10-CM | POA: Diagnosis not present

## 2020-10-21 DIAGNOSIS — Z20822 Contact with and (suspected) exposure to covid-19: Secondary | ICD-10-CM | POA: Diagnosis not present

## 2020-10-21 DIAGNOSIS — Z03818 Encounter for observation for suspected exposure to other biological agents ruled out: Secondary | ICD-10-CM | POA: Diagnosis not present

## 2020-11-09 DIAGNOSIS — F902 Attention-deficit hyperactivity disorder, combined type: Secondary | ICD-10-CM | POA: Diagnosis not present

## 2020-11-09 DIAGNOSIS — F411 Generalized anxiety disorder: Secondary | ICD-10-CM | POA: Diagnosis not present

## 2020-11-23 DIAGNOSIS — F411 Generalized anxiety disorder: Secondary | ICD-10-CM | POA: Diagnosis not present

## 2020-11-23 DIAGNOSIS — R278 Other lack of coordination: Secondary | ICD-10-CM | POA: Diagnosis not present

## 2020-11-23 DIAGNOSIS — F902 Attention-deficit hyperactivity disorder, combined type: Secondary | ICD-10-CM | POA: Diagnosis not present

## 2020-12-15 DIAGNOSIS — R278 Other lack of coordination: Secondary | ICD-10-CM | POA: Diagnosis not present

## 2020-12-15 DIAGNOSIS — Z79899 Other long term (current) drug therapy: Secondary | ICD-10-CM | POA: Diagnosis not present

## 2020-12-15 DIAGNOSIS — F411 Generalized anxiety disorder: Secondary | ICD-10-CM | POA: Diagnosis not present

## 2020-12-15 DIAGNOSIS — F902 Attention-deficit hyperactivity disorder, combined type: Secondary | ICD-10-CM | POA: Diagnosis not present

## 2020-12-25 DIAGNOSIS — Z03818 Encounter for observation for suspected exposure to other biological agents ruled out: Secondary | ICD-10-CM | POA: Diagnosis not present

## 2020-12-25 DIAGNOSIS — Z20822 Contact with and (suspected) exposure to covid-19: Secondary | ICD-10-CM | POA: Diagnosis not present

## 2021-01-24 DIAGNOSIS — F411 Generalized anxiety disorder: Secondary | ICD-10-CM | POA: Diagnosis not present

## 2021-02-15 DIAGNOSIS — Z780 Asymptomatic menopausal state: Secondary | ICD-10-CM | POA: Diagnosis not present

## 2021-02-15 DIAGNOSIS — E559 Vitamin D deficiency, unspecified: Secondary | ICD-10-CM | POA: Diagnosis not present

## 2021-02-15 DIAGNOSIS — R5383 Other fatigue: Secondary | ICD-10-CM | POA: Diagnosis not present

## 2021-02-15 DIAGNOSIS — Z77120 Contact with and (suspected) exposure to mold (toxic): Secondary | ICD-10-CM | POA: Diagnosis not present

## 2021-03-01 DIAGNOSIS — F411 Generalized anxiety disorder: Secondary | ICD-10-CM | POA: Diagnosis not present

## 2021-03-01 DIAGNOSIS — F902 Attention-deficit hyperactivity disorder, combined type: Secondary | ICD-10-CM | POA: Diagnosis not present

## 2021-03-10 DIAGNOSIS — Z1331 Encounter for screening for depression: Secondary | ICD-10-CM | POA: Diagnosis not present

## 2021-03-10 DIAGNOSIS — Z0000 Encounter for general adult medical examination without abnormal findings: Secondary | ICD-10-CM | POA: Diagnosis not present

## 2021-03-10 DIAGNOSIS — Z79899 Other long term (current) drug therapy: Secondary | ICD-10-CM | POA: Diagnosis not present

## 2021-03-17 DIAGNOSIS — Z780 Asymptomatic menopausal state: Secondary | ICD-10-CM | POA: Diagnosis not present

## 2021-03-17 DIAGNOSIS — R5383 Other fatigue: Secondary | ICD-10-CM | POA: Diagnosis not present

## 2021-03-17 DIAGNOSIS — E559 Vitamin D deficiency, unspecified: Secondary | ICD-10-CM | POA: Diagnosis not present

## 2021-03-17 DIAGNOSIS — Z77120 Contact with and (suspected) exposure to mold (toxic): Secondary | ICD-10-CM | POA: Diagnosis not present

## 2021-03-18 DIAGNOSIS — Z1239 Encounter for other screening for malignant neoplasm of breast: Secondary | ICD-10-CM | POA: Diagnosis not present

## 2021-03-18 DIAGNOSIS — Z1231 Encounter for screening mammogram for malignant neoplasm of breast: Secondary | ICD-10-CM | POA: Diagnosis not present

## 2021-03-23 DIAGNOSIS — Z1211 Encounter for screening for malignant neoplasm of colon: Secondary | ICD-10-CM | POA: Diagnosis not present

## 2021-03-23 DIAGNOSIS — Z01818 Encounter for other preprocedural examination: Secondary | ICD-10-CM | POA: Diagnosis not present

## 2021-03-24 ENCOUNTER — Other Ambulatory Visit: Payer: Self-pay | Admitting: General Practice

## 2021-03-24 DIAGNOSIS — M8580 Other specified disorders of bone density and structure, unspecified site: Secondary | ICD-10-CM

## 2021-03-24 DIAGNOSIS — Z780 Asymptomatic menopausal state: Secondary | ICD-10-CM

## 2021-05-09 DIAGNOSIS — K640 First degree hemorrhoids: Secondary | ICD-10-CM | POA: Diagnosis not present

## 2021-05-09 DIAGNOSIS — Z86010 Personal history of colonic polyps: Secondary | ICD-10-CM | POA: Diagnosis not present

## 2021-05-23 DIAGNOSIS — Z01419 Encounter for gynecological examination (general) (routine) without abnormal findings: Secondary | ICD-10-CM | POA: Diagnosis not present

## 2021-05-23 DIAGNOSIS — Z6822 Body mass index (BMI) 22.0-22.9, adult: Secondary | ICD-10-CM | POA: Diagnosis not present

## 2021-05-23 DIAGNOSIS — Z113 Encounter for screening for infections with a predominantly sexual mode of transmission: Secondary | ICD-10-CM | POA: Diagnosis not present

## 2021-05-23 DIAGNOSIS — Z124 Encounter for screening for malignant neoplasm of cervix: Secondary | ICD-10-CM | POA: Diagnosis not present

## 2021-05-31 DIAGNOSIS — M8589 Other specified disorders of bone density and structure, multiple sites: Secondary | ICD-10-CM | POA: Diagnosis not present

## 2021-10-20 DIAGNOSIS — M810 Age-related osteoporosis without current pathological fracture: Secondary | ICD-10-CM | POA: Diagnosis not present

## 2021-10-20 DIAGNOSIS — R5383 Other fatigue: Secondary | ICD-10-CM | POA: Diagnosis not present

## 2021-10-20 DIAGNOSIS — E559 Vitamin D deficiency, unspecified: Secondary | ICD-10-CM | POA: Diagnosis not present

## 2021-10-20 DIAGNOSIS — M2550 Pain in unspecified joint: Secondary | ICD-10-CM | POA: Diagnosis not present

## 2022-03-10 DIAGNOSIS — Z131 Encounter for screening for diabetes mellitus: Secondary | ICD-10-CM | POA: Diagnosis not present

## 2022-03-10 DIAGNOSIS — Z0000 Encounter for general adult medical examination without abnormal findings: Secondary | ICD-10-CM | POA: Diagnosis not present

## 2022-03-10 DIAGNOSIS — Z13220 Encounter for screening for lipoid disorders: Secondary | ICD-10-CM | POA: Diagnosis not present

## 2022-03-10 DIAGNOSIS — Z1331 Encounter for screening for depression: Secondary | ICD-10-CM | POA: Diagnosis not present

## 2023-07-17 ENCOUNTER — Other Ambulatory Visit: Payer: Self-pay | Admitting: Obstetrics and Gynecology

## 2023-07-17 DIAGNOSIS — Z1231 Encounter for screening mammogram for malignant neoplasm of breast: Secondary | ICD-10-CM

## 2023-08-02 ENCOUNTER — Other Ambulatory Visit: Payer: Self-pay | Admitting: Obstetrics and Gynecology

## 2023-08-02 DIAGNOSIS — Z1231 Encounter for screening mammogram for malignant neoplasm of breast: Secondary | ICD-10-CM

## 2023-08-21 ENCOUNTER — Ambulatory Visit
Admission: RE | Admit: 2023-08-21 | Discharge: 2023-08-21 | Disposition: A | Discharge status: Home / Self Care | Payer: BC Managed Care – PPO | Source: Ambulatory Visit | Attending: Obstetrics and Gynecology | Admitting: Obstetrics and Gynecology

## 2023-08-21 DIAGNOSIS — Z1231 Encounter for screening mammogram for malignant neoplasm of breast: Secondary | ICD-10-CM | POA: Diagnosis present

## 2023-08-22 ENCOUNTER — Other Ambulatory Visit: Payer: Self-pay | Admitting: *Deleted

## 2023-08-22 ENCOUNTER — Inpatient Hospital Stay
Admission: RE | Admit: 2023-08-22 | Discharge: 2023-08-22 | Disposition: A | Discharge status: Home / Self Care | Payer: Self-pay | Source: Ambulatory Visit | Attending: Internal Medicine | Admitting: Internal Medicine

## 2023-08-22 DIAGNOSIS — Z1231 Encounter for screening mammogram for malignant neoplasm of breast: Secondary | ICD-10-CM

## 2024-06-09 ENCOUNTER — Other Ambulatory Visit: Payer: Self-pay | Admitting: Obstetrics and Gynecology

## 2024-06-09 DIAGNOSIS — M8580 Other specified disorders of bone density and structure, unspecified site: Secondary | ICD-10-CM
